# Patient Record
Sex: Female | Born: 2000 | Race: Asian | Hispanic: No | Marital: Single | Smoking: Never smoker
Health system: Southern US, Community
[De-identification: ages and names within clinical notes are randomized; demographics above are authoritative.]

## PROBLEM LIST (undated history)

## (undated) DIAGNOSIS — Z8619 Personal history of other infectious and parasitic diseases: Secondary | ICD-10-CM

## (undated) DIAGNOSIS — T7840XA Allergy, unspecified, initial encounter: Secondary | ICD-10-CM

## (undated) DIAGNOSIS — Z8759 Personal history of other complications of pregnancy, childbirth and the puerperium: Secondary | ICD-10-CM

## (undated) DIAGNOSIS — A749 Chlamydial infection, unspecified: Secondary | ICD-10-CM

## (undated) DIAGNOSIS — Z789 Other specified health status: Secondary | ICD-10-CM

## (undated) HISTORY — DX: Personal history of other infectious and parasitic diseases: Z86.19

## (undated) HISTORY — DX: Chlamydial infection, unspecified: A74.9

## (undated) HISTORY — DX: Allergy, unspecified, initial encounter: T78.40XA

## (undated) HISTORY — PX: WISDOM TOOTH EXTRACTION: SHX21

## (undated) HISTORY — DX: Personal history of other complications of pregnancy, childbirth and the puerperium: Z87.59

## (undated) HISTORY — DX: Other specified health status: Z78.9

---
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
## 2018-02-03 ENCOUNTER — Encounter (HOSPITAL_BASED_OUTPATIENT_CLINIC_OR_DEPARTMENT_OTHER): Payer: Self-pay

## 2018-02-03 ENCOUNTER — Other Ambulatory Visit: Payer: Self-pay

## 2018-02-03 ENCOUNTER — Emergency Department (HOSPITAL_BASED_OUTPATIENT_CLINIC_OR_DEPARTMENT_OTHER)
Admission: EM | Admit: 2018-02-03 | Discharge: 2018-02-04 | Disposition: A | Discharge status: Home / Self Care | Payer: Medicaid Other | Attending: Emergency Medicine | Admitting: Emergency Medicine

## 2018-02-03 DIAGNOSIS — Z79899 Other long term (current) drug therapy: Secondary | ICD-10-CM | POA: Diagnosis not present

## 2018-02-03 DIAGNOSIS — L509 Urticaria, unspecified: Secondary | ICD-10-CM | POA: Diagnosis not present

## 2018-02-03 MED ORDER — PREDNISONE 10 MG PO TABS: 20.0000 mg | ORAL_TABLET | Freq: Every day | ORAL | 0 refills | Status: AC

## 2018-02-03 MED ORDER — METHYLPREDNISOLONE SODIUM SUCC 40 MG IJ SOLR
40.0000 mg | Freq: Once | INTRAMUSCULAR | Status: AC
  Administered 2018-02-03: 40 mg via INTRAMUSCULAR
  Filled 2018-02-03: qty 1

## 2018-02-03 NOTE — ED Triage Notes (Signed)
Pt broke out into hives this morning, NKDA, pt denies new soaps, denies new detergents, denies new foods, took 90mL benadryl at 2030

## 2018-02-03 NOTE — ED Provider Notes (Signed)
[facility] EMERGENCY DEPARTMENT Provider Note  CSN: [id number] Arrival date & time: 02/03/18  2231  History   Chief Complaint Chief Complaint  Patient presents with  . Urticaria    HPI Sherry [name] is a 17 y.o. female with a medical history of allergies who presented to the ED for rash x1 day. Patient reports waking up this morning with hives around her bikini line. She describes these areas as red, raised and pruritic. Throughout the day, this rash has spread to her trunk, back and is now on her forehead. Denies any new exposures, such as clothing, body washes, laundry detergents or anything environmental. Denies recent travel, illness, sick contacts or wounds/abrasions/bites. Denies fever, chills, bleeding, bruising, oral lesions, throat/mouth/lip swelling, chest tightness, palpitations or SOB. Patient tried Benadryl prior to coming to the ED.  Past Medical History:  Diagnosis Date  . Allergy    seasonal    There are no active problems to display for this patient.   History reviewed. No pertinent surgical history.   OB History    Gravida  0   Para  0   Term  0   Preterm  0   AB  0   Living  0     SAB  0   TAB  0   Ectopic  0   Multiple  0   Live Births               Home Medications    Prior to Admission medications   Medication Sig Start Date End Date Taking? Authorizing Provider  metroNIDAZOLE (FLAGYL) 500 MG tablet Take 1 tablet (500 mg total) by mouth 2 (two) times daily. 05/03/16   Trixie Dredge, PA-C  predniSONE (DELTASONE) 10 MG tablet Take 2 tablets (20 mg total) by mouth daily for 5 days. 02/03/18 02/08/18  Mortis, Sharyon Medicus, PA-C    Family History No family history on file.  Social History Social History   Tobacco Use  . Smoking status: Never Smoker  . Smokeless tobacco: Never Used  Substance Use Topics  . Alcohol use: No  . Drug use: No     Allergies   Patient has no known allergies.   Review of Systems Review of  Systems  Constitutional: Negative for chills and fever.  HENT: Negative.   Respiratory: Negative for cough, chest tightness and shortness of breath.   Cardiovascular: Negative for chest pain and palpitations.  Gastrointestinal: Negative.   Musculoskeletal: Negative for arthralgias, joint swelling and myalgias.  Skin: Positive for rash.  Hematological: Does not bruise/bleed easily.   Physical Exam Updated Vital Signs BP 127/74 (BP Location: Right Arm)   Pulse 65   Temp 98.5 F (36.9 C) (Oral)   Resp 16   Ht 5\' 3"  (1.6 m)   Wt 62.6 kg   LMP 02/02/2018   SpO2 100%   BMI 24.45 kg/m   Physical Exam  Constitutional: Vital signs are normal. She appears well-developed and well-nourished.  HENT:  Head: Normocephalic and atraumatic.  Mouth/Throat: Uvula is midline, oropharynx is clear and moist and mucous membranes are normal.  No oral or buccal lesions.  Eyes: Pupils are equal, round, and reactive to light. Conjunctivae, EOM and lids are normal.  No periorbital edema.  Neck: Trachea normal, normal range of motion, full passive range of motion without pain and phonation normal. Neck supple. No neck rigidity. Normal range of motion present.  Cardiovascular: Normal rate, regular rhythm and normal heart sounds.  No murmur heard.  Pulmonary/Chest: Effort normal and breath sounds normal.  Abdominal: Soft. Normal appearance and bowel sounds are normal. There is no tenderness.  Skin: Skin is warm and intact. Capillary refill takes less than 2 seconds. Rash noted. Rash is urticarial.  Multiple erythematous urticarial patches on patient's forehead, back, trunk and upper lower extremities. No areas of desquamation, ulcerations, drainage/bleeding or blisters.  Nursing note and vitals reviewed.    ED Treatments / Results  Labs (all labs ordered are listed, but only abnormal results are displayed) Labs Reviewed - No data to display  EKG None  Radiology No results  found.  Procedures Procedures (including critical care time)  Medications Ordered in ED Medications  methylPREDNISolone sodium succinate (SOLU-MEDROL) 40 mg/mL injection 40 mg (40 mg Intramuscular Given 02/03/18 2352)     Initial Impression / Assessment and Plan / ED Course  Triage vital signs and the nursing notes have been reviewed.  Pertinent labs & imaging results that were available during care of the patient were reviewed and considered in medical decision making (see chart for details).   Patient presents to the ED with an urticarial rash that spontaneously showed up this morning. She denies any new exposures, medications, allergens or travel. Rash is located mainly on her trunk, back and forehead. She denies any signs of anaphylaxis. Rash is not concerning for necrotizing process or SJS/TEN Given that the rash has spread quickly in a short amount of time, will administer IM steroids in the ED and follow with outpatient course of PO steroids.  Final Clinical Impressions(s) / ED Diagnoses  1. Urticaria. IM Solu-Medrol 40mg  x1 given in the ED. Rx for Prednisone x5 days. Education provided on follow-up with PCP vs signs of anaphylaxis that would warrant ED return.  Dispo: Home. After thorough clinical evaluation, this patient is determined to be medically stable and can be safely discharged with the previously mentioned treatment and/or outpatient follow-up/referral(s). At this time, there are no other apparent medical conditions that require further screening, evaluation or treatment.   Final diagnoses:  Urticaria    ED Discharge Orders         Ordered    predniSONE (DELTASONE) 10 MG tablet  Daily     02/03/18 2334            Reva Bores 02/04/18 1000    Gwyneth Sprout, MD 02/04/18 1605

## 2018-02-03 NOTE — Discharge Instructions (Addendum)
I am not sure why you are having this reaction today. You have been given a dose of steroids today. You can start taking the pills tomorrow morning. In addition, you may add Benadryl and Pepcid/Zantac to help with itching.  Follow-up with your PCP if you still have this rash even after taking the steroids.  Follow-up with a medical provider sooner if you begin to have a fever, throat/mouth/lipswelling, difficulty breathing or if the rash starts to bleed, drain or develops into a burn.

## 2018-05-27 NOTE — L&D Delivery Note (Signed)
Delivery Note At 5:57 PM a viable female was delivered via  (Presentation:  ROA).  APGAR: 9, 9; weight pending.   Placenta status: spontaneous, intact.  Cord: 3 vessels    Anesthesia: Epidural Episiotomy:  n/a Lacerations:  none Suture Repair: n/a Est. Blood Loss (mL):  150  With inspection of perineum, 3 small, dime sized hematomas noted. One at bottom of perineum, one inside and on the left side of the introitus and one on the left labia. Will monitor.   Mom to postpartum.  Baby to Couplet care / Skin to Skin.  Wende Mott CNM 05/09/2019, 6:08 PM

## 2018-12-02 LAB — OB RESULTS CONSOLE HEPATITIS B SURFACE ANTIGEN: Hepatitis B Surface Ag: NEGATIVE

## 2018-12-02 LAB — OB RESULTS CONSOLE GC/CHLAMYDIA
Chlamydia: NEGATIVE
Gonorrhea: NEGATIVE

## 2018-12-02 LAB — OB RESULTS CONSOLE HIV ANTIBODY (ROUTINE TESTING): HIV: NONREACTIVE

## 2018-12-02 LAB — OB RESULTS CONSOLE RUBELLA ANTIBODY, IGM: Rubella: IMMUNE

## 2018-12-02 LAB — OB RESULTS CONSOLE VARICELLA ZOSTER ANTIBODY, IGG: Varicella: IMMUNE

## 2018-12-03 ENCOUNTER — Other Ambulatory Visit (HOSPITAL_COMMUNITY): Payer: Self-pay | Admitting: Nurse Practitioner

## 2018-12-03 DIAGNOSIS — Z363 Encounter for antenatal screening for malformations: Secondary | ICD-10-CM

## 2018-12-24 ENCOUNTER — Other Ambulatory Visit: Payer: Self-pay

## 2018-12-24 DIAGNOSIS — Z20822 Contact with and (suspected) exposure to covid-19: Secondary | ICD-10-CM

## 2018-12-25 ENCOUNTER — Ambulatory Visit (HOSPITAL_COMMUNITY): Payer: Medicaid Other

## 2018-12-25 ENCOUNTER — Encounter (HOSPITAL_COMMUNITY): Payer: Self-pay

## 2018-12-26 LAB — NOVEL CORONAVIRUS, NAA: SARS-CoV-2, NAA: NOT DETECTED

## 2019-01-05 ENCOUNTER — Encounter (HOSPITAL_COMMUNITY): Payer: Self-pay | Admitting: *Deleted

## 2019-01-08 ENCOUNTER — Ambulatory Visit (HOSPITAL_COMMUNITY)
Admission: RE | Admit: 2019-01-08 | Discharge: 2019-01-08 | Disposition: A | Discharge status: Home / Self Care | Payer: Medicaid Other | Source: Ambulatory Visit | Attending: Obstetrics and Gynecology | Admitting: Obstetrics and Gynecology

## 2019-01-08 ENCOUNTER — Other Ambulatory Visit: Payer: Self-pay

## 2019-01-08 ENCOUNTER — Other Ambulatory Visit (HOSPITAL_COMMUNITY): Payer: Self-pay | Admitting: *Deleted

## 2019-01-08 DIAGNOSIS — Z363 Encounter for antenatal screening for malformations: Secondary | ICD-10-CM | POA: Diagnosis not present

## 2019-01-08 DIAGNOSIS — Z362 Encounter for other antenatal screening follow-up: Secondary | ICD-10-CM

## 2019-01-08 DIAGNOSIS — Z3A21 21 weeks gestation of pregnancy: Secondary | ICD-10-CM

## 2019-02-05 ENCOUNTER — Ambulatory Visit (HOSPITAL_COMMUNITY): Payer: Medicaid Other

## 2019-02-10 ENCOUNTER — Encounter (HOSPITAL_COMMUNITY): Payer: Self-pay

## 2019-02-10 ENCOUNTER — Other Ambulatory Visit: Payer: Self-pay

## 2019-02-10 ENCOUNTER — Ambulatory Visit (HOSPITAL_COMMUNITY)
Admission: RE | Admit: 2019-02-10 | Discharge: 2019-02-10 | Disposition: A | Discharge status: Home / Self Care | Payer: Medicaid Other | Source: Ambulatory Visit | Attending: Maternal & Fetal Medicine | Admitting: Maternal & Fetal Medicine

## 2019-02-10 ENCOUNTER — Ambulatory Visit (HOSPITAL_COMMUNITY): Payer: Medicaid Other | Admitting: *Deleted

## 2019-02-10 VITALS — BP 121/63 | HR 91 | Temp 98.4°F

## 2019-02-10 DIAGNOSIS — Z3A26 26 weeks gestation of pregnancy: Secondary | ICD-10-CM | POA: Diagnosis not present

## 2019-02-10 DIAGNOSIS — Z362 Encounter for other antenatal screening follow-up: Secondary | ICD-10-CM

## 2019-04-30 LAB — OB RESULTS CONSOLE GC/CHLAMYDIA
Chlamydia: NEGATIVE
Gonorrhea: NEGATIVE

## 2019-04-30 LAB — OB RESULTS CONSOLE GBS: GBS: NEGATIVE

## 2019-05-09 ENCOUNTER — Encounter (HOSPITAL_COMMUNITY): Payer: Self-pay | Admitting: Family Medicine

## 2019-05-09 ENCOUNTER — Inpatient Hospital Stay (HOSPITAL_COMMUNITY)
Admission: AD | Admit: 2019-05-09 | Discharge: 2019-05-11 | DRG: 806 | Disposition: A | Discharge status: Home / Self Care | Payer: Medicaid Other | Attending: Obstetrics and Gynecology | Admitting: Obstetrics and Gynecology

## 2019-05-09 ENCOUNTER — Other Ambulatory Visit: Payer: Self-pay

## 2019-05-09 ENCOUNTER — Inpatient Hospital Stay (HOSPITAL_COMMUNITY): Payer: Medicaid Other | Admitting: Anesthesiology

## 2019-05-09 DIAGNOSIS — O134 Gestational [pregnancy-induced] hypertension without significant proteinuria, complicating childbirth: Secondary | ICD-10-CM | POA: Diagnosis present

## 2019-05-09 DIAGNOSIS — O4202 Full-term premature rupture of membranes, onset of labor within 24 hours of rupture: Secondary | ICD-10-CM | POA: Diagnosis not present

## 2019-05-09 DIAGNOSIS — Z20828 Contact with and (suspected) exposure to other viral communicable diseases: Secondary | ICD-10-CM | POA: Diagnosis present

## 2019-05-09 DIAGNOSIS — O4290 Premature rupture of membranes, unspecified as to length of time between rupture and onset of labor, unspecified weeks of gestation: Secondary | ICD-10-CM | POA: Diagnosis present

## 2019-05-09 DIAGNOSIS — Z23 Encounter for immunization: Secondary | ICD-10-CM

## 2019-05-09 DIAGNOSIS — O26893 Other specified pregnancy related conditions, third trimester: Secondary | ICD-10-CM | POA: Diagnosis present

## 2019-05-09 DIAGNOSIS — Z3A38 38 weeks gestation of pregnancy: Secondary | ICD-10-CM

## 2019-05-09 DIAGNOSIS — N898 Other specified noninflammatory disorders of vagina: Secondary | ICD-10-CM

## 2019-05-09 DIAGNOSIS — O139 Gestational [pregnancy-induced] hypertension without significant proteinuria, unspecified trimester: Secondary | ICD-10-CM

## 2019-05-09 DIAGNOSIS — O4292 Full-term premature rupture of membranes, unspecified as to length of time between rupture and onset of labor: Secondary | ICD-10-CM | POA: Diagnosis present

## 2019-05-09 LAB — PROTEIN / CREATININE RATIO, URINE
Creatinine, Urine: 26.1 mg/dL
Total Protein, Urine: <6 mg/dL

## 2019-05-09 LAB — COMPREHENSIVE METABOLIC PANEL
ALT: 19 U/L (ref 0–44)
AST: 20 U/L (ref 15–41)
Albumin: 2.8 g/dL — ABNORMAL LOW (ref 3.5–5.0)
Alkaline Phosphatase: 148 U/L — ABNORMAL HIGH (ref 38–126)
Anion gap: 12 (ref 5–15)
BUN: 6 mg/dL (ref 6–20)
CO2: 21 mmol/L — ABNORMAL LOW (ref 22–32)
Calcium: 9.6 mg/dL (ref 8.9–10.3)
Chloride: 105 mmol/L (ref 98–111)
Creatinine, Ser: 0.37 mg/dL — ABNORMAL LOW (ref 0.44–1.00)
GFR calc Af Amer: >60 mL/min (ref >60)
GFR calc non Af Amer: >60 mL/min (ref >60)
Glucose, Bld: 94 mg/dL (ref 70–99)
Potassium: 3.8 mmol/L (ref 3.5–5.1)
Sodium: 138 mmol/L (ref 135–145)
Total Bilirubin: 0.5 mg/dL (ref 0.3–1.2)
Total Protein: 6.2 g/dL — ABNORMAL LOW (ref 6.5–8.1)

## 2019-05-09 LAB — TYPE AND SCREEN
ABO/RH(D): O POS
Antibody Screen: NEGATIVE

## 2019-05-09 LAB — CBC
HCT: 36.7 % (ref 36.0–46.0)
Hemoglobin: 11.9 g/dL — ABNORMAL LOW (ref 12.0–15.0)
MCH: 27.5 pg (ref 26.0–34.0)
MCHC: 32.4 g/dL (ref 30.0–36.0)
MCV: 85 fL (ref 80.0–100.0)
Platelets: 216 10*3/uL (ref 150–400)
RBC: 4.32 MIL/uL (ref 3.87–5.11)
RDW: 14 % (ref 11.5–15.5)
WBC: 12.7 10*3/uL — ABNORMAL HIGH (ref 4.0–10.5)
nRBC: 0 % (ref 0.0–0.2)

## 2019-05-09 LAB — RPR: RPR Ser Ql: NONREACTIVE

## 2019-05-09 LAB — SARS CORONAVIRUS 2 (TAT 6-24 HRS): SARS Coronavirus 2: NEGATIVE

## 2019-05-09 LAB — ABO/RH: ABO/RH(D): O POS

## 2019-05-09 MED ORDER — LACTATED RINGERS IV SOLN
INTRAVENOUS | Status: DC
  Administered 2019-05-09 (×2): via INTRAVENOUS

## 2019-05-09 MED ORDER — FENTANYL CITRATE (PF) 100 MCG/2ML IJ SOLN: 50.0000 ug | INTRAMUSCULAR | Status: DC | PRN

## 2019-05-09 MED ORDER — OXYTOCIN 40 UNITS IN NORMAL SALINE INFUSION - SIMPLE MED: 2.5000 [IU]/h | INTRAVENOUS | Status: DC

## 2019-05-09 MED ORDER — ZOLPIDEM TARTRATE 5 MG PO TABS: 5.0000 mg | ORAL_TABLET | Freq: Every evening | ORAL | Status: DC | PRN

## 2019-05-09 MED ORDER — MISOPROSTOL 50MCG HALF TABLET
50.0000 ug | ORAL_TABLET | ORAL | Status: DC | PRN
  Administered 2019-05-09: 50 ug via ORAL
  Filled 2019-05-09: qty 1

## 2019-05-09 MED ORDER — LACTATED RINGERS IV SOLN: 500.0000 mL | Freq: Once | INTRAVENOUS | Status: DC

## 2019-05-09 MED ORDER — OXYTOCIN BOLUS FROM INFUSION
500.0000 mL | Freq: Once | INTRAVENOUS | Status: AC
  Administered 2019-05-09: 18:00:00 500 mL via INTRAVENOUS

## 2019-05-09 MED ORDER — TERBUTALINE SULFATE 1 MG/ML IJ SOLN: 0.2500 mg | Freq: Once | INTRAMUSCULAR | Status: DC | PRN

## 2019-05-09 MED ORDER — DIBUCAINE (PERIANAL) 1 % EX OINT: 1.0000 "application " | TOPICAL_OINTMENT | CUTANEOUS | Status: DC | PRN

## 2019-05-09 MED ORDER — BENZOCAINE-MENTHOL 20-0.5 % EX AERO: 1.0000 "application " | INHALATION_SPRAY | CUTANEOUS | Status: DC | PRN

## 2019-05-09 MED ORDER — LIDOCAINE HCL (PF) 1 % IJ SOLN
INTRAMUSCULAR | Status: DC | PRN
  Administered 2019-05-09: 10 mL via EPIDURAL
  Administered 2019-05-09: 2 mL via EPIDURAL

## 2019-05-09 MED ORDER — PHENYLEPHRINE 40 MCG/ML (10ML) SYRINGE FOR IV PUSH (FOR BLOOD PRESSURE SUPPORT): 80.0000 ug | PREFILLED_SYRINGE | INTRAVENOUS | Status: DC | PRN

## 2019-05-09 MED ORDER — EPHEDRINE 5 MG/ML INJ: 10.0000 mg | INTRAVENOUS | Status: DC | PRN

## 2019-05-09 MED ORDER — SOD CITRATE-CITRIC ACID 500-334 MG/5ML PO SOLN: 30.0000 mL | ORAL | Status: DC | PRN

## 2019-05-09 MED ORDER — ACETAMINOPHEN 325 MG PO TABS: 650.0000 mg | ORAL_TABLET | ORAL | Status: DC | PRN

## 2019-05-09 MED ORDER — PRENATAL MULTIVITAMIN CH
1.0000 | ORAL_TABLET | Freq: Every day | ORAL | Status: DC
  Administered 2019-05-10 – 2019-05-11 (×2): 1 via ORAL
  Filled 2019-05-09 (×2): qty 1

## 2019-05-09 MED ORDER — COCONUT OIL OIL
1.0000 "application " | TOPICAL_OIL | Status: DC | PRN
  Administered 2019-05-10: 1 via TOPICAL

## 2019-05-09 MED ORDER — FENTANYL-BUPIVACAINE-NACL 0.5-0.125-0.9 MG/250ML-% EP SOLN: 12.0000 mL/h | EPIDURAL | Status: DC | PRN

## 2019-05-09 MED ORDER — SIMETHICONE 80 MG PO CHEW: 80.0000 mg | CHEWABLE_TABLET | ORAL | Status: DC | PRN

## 2019-05-09 MED ORDER — DIPHENHYDRAMINE HCL 50 MG/ML IJ SOLN: 12.5000 mg | INTRAMUSCULAR | Status: DC | PRN

## 2019-05-09 MED ORDER — IBUPROFEN 600 MG PO TABS
600.0000 mg | ORAL_TABLET | Freq: Four times a day (QID) | ORAL | Status: DC
  Administered 2019-05-10 – 2019-05-11 (×3): 600 mg via ORAL
  Filled 2019-05-09 (×6): qty 1

## 2019-05-09 MED ORDER — OXYTOCIN 40 UNITS IN NORMAL SALINE INFUSION - SIMPLE MED
1.0000 m[IU]/min | INTRAVENOUS | Status: DC
  Administered 2019-05-09: 2 m[IU]/min via INTRAVENOUS
  Filled 2019-05-09: qty 1000

## 2019-05-09 MED ORDER — FENTANYL-BUPIVACAINE-NACL 0.5-0.125-0.9 MG/250ML-% EP SOLN
EPIDURAL | Status: AC
  Filled 2019-05-09: qty 250

## 2019-05-09 MED ORDER — ONDANSETRON HCL 4 MG/2ML IJ SOLN: 4.0000 mg | Freq: Four times a day (QID) | INTRAMUSCULAR | Status: DC | PRN

## 2019-05-09 MED ORDER — ONDANSETRON HCL 4 MG/2ML IJ SOLN: 4.0000 mg | INTRAMUSCULAR | Status: DC | PRN

## 2019-05-09 MED ORDER — LIDOCAINE HCL (PF) 1 % IJ SOLN: 30.0000 mL | INTRAMUSCULAR | Status: DC | PRN

## 2019-05-09 MED ORDER — LACTATED RINGERS IV SOLN: 500.0000 mL | INTRAVENOUS | Status: DC | PRN

## 2019-05-09 MED ORDER — TETANUS-DIPHTH-ACELL PERTUSSIS 5-2.5-18.5 LF-MCG/0.5 IM SUSP: 0.5000 mL | Freq: Once | INTRAMUSCULAR | Status: DC

## 2019-05-09 MED ORDER — SENNOSIDES-DOCUSATE SODIUM 8.6-50 MG PO TABS
2.0000 | ORAL_TABLET | ORAL | Status: DC
  Administered 2019-05-10 (×2): 2 via ORAL
  Filled 2019-05-09 (×2): qty 2

## 2019-05-09 MED ORDER — WITCH HAZEL-GLYCERIN EX PADS: 1.0000 "application " | MEDICATED_PAD | CUTANEOUS | Status: DC | PRN

## 2019-05-09 MED ORDER — DIPHENHYDRAMINE HCL 25 MG PO CAPS: 25.0000 mg | ORAL_CAPSULE | Freq: Four times a day (QID) | ORAL | Status: DC | PRN

## 2019-05-09 MED ORDER — SODIUM CHLORIDE (PF) 0.9 % IJ SOLN
INTRAMUSCULAR | Status: DC | PRN
  Administered 2019-05-09: 12 mL/h via EPIDURAL

## 2019-05-09 MED ORDER — SODIUM BICARBONATE 8.4 % IV SOLN
INTRAVENOUS | Status: DC | PRN
  Administered 2019-05-09: 5 mL via EPIDURAL

## 2019-05-09 MED ORDER — ONDANSETRON HCL 4 MG PO TABS: 4.0000 mg | ORAL_TABLET | ORAL | Status: DC | PRN

## 2019-05-09 NOTE — H&P (Signed)
LABOR AND DELIVERY ADMISSION HISTORY AND PHYSICAL NOTE  Sherry [name] is a 18 y.o. female G1P0000 with IUP at 6832w5d by LMP c/w 26wk US presenting for SROM.   Had leakage of fluid around 0300 this AM, clear fluid. Since then has had constant trickling.   She reports positive fetal movement. She denies vaginal bleeding. Not feeling contractions.   She plans on breast feeding. She requests Depo for birth control.  Prenatal History/Complications: [facility] [place]:  @[redacted]w[redacted]d , CWD, normal anatomy, cephalic presentation, posterior placenta, 36%ile, EFW 890 grams  Pregnancy complications:  - none  Past Medical History: Past Medical History:  Diagnosis Date  . Allergy    seasonal  . Medical history non-contributory     Past Surgical History: Past Surgical History:  Procedure Laterality Date  . WISDOM TOOTH EXTRACTION      Obstetrical History: OB History    Gravida  1   Para  0   Term  0   Preterm  0   AB  0   Living  0     SAB  0   TAB  0   Ectopic  0   Multiple  0   Live Births              Social History: Social History   Socioeconomic History  . Marital status: Single    Spouse name: n/a  . Number of children: 0  . Years of education: Not on file  . Highest education level: Not on file  Occupational History  . Occupation: student    Comment: [employer]  Tobacco Use  . Smoking status: Never Smoker  . Smokeless tobacco: Never Used  Substance and Sexual Activity  . Alcohol use: No  . Drug use: No  . Sexual activity: Never    Birth control/protection: None  Other Topics Concern  . Not on file  Social History Narrative   8th grader at [employer].   Lives with her mother and older brother.   Older sister lives with maternal grandmother.   Sees father some evenings and weekends.  She's not comfortable communicating with him.   Social Determinants of Health   Financial Resource Strain:   . Difficulty of Paying Living Expenses:  Not on file  Food Insecurity:   . Worried About Programme researcher, broadcasting/film/videounning Out of Food in the Last Year: Not on file  . Ran Out of Food in the Last Year: Not on file  Transportation Needs:   . Lack of Transportation (Medical): Not on file  . Lack of Transportation (Non-Medical): Not on file  Physical Activity:   . Days of Exercise per Week: Not on file  . Minutes of Exercise per Session: Not on file  Stress:   . Feeling of Stress : Not on file  Social Connections:   . Frequency of Communication with Friends and Family: Not on file  . Frequency of Social Gatherings with Friends and Family: Not on file  . Attends Religious Services: Not on file  . Active Member of Clubs or Organizations: Not on file  . Attends BankerClub or Organization Meetings: Not on file  . Marital Status: Not on file    Family History: History reviewed. No pertinent family history.  Allergies: No Known Allergies  Medications Prior to Admission  Medication Sig Dispense Refill Last Dose  . prenatal vitamin w/FE, FA (PRENATAL 1 + 1) 27-1 MG TABS tablet Take 1 tablet by mouth daily at 12 noon.  05/08/2019 at Unknown time     Review of Systems  All systems reviewed and negative except as stated in HPI  Physical Exam Blood pressure 130/77, pulse 92, temperature 97.9 F (36.6 C), temperature source Oral, resp. rate 17, height 5' 3.5" (1.613 m), weight 78.6 kg, last menstrual period 08/11/2018. General appearance: alert, oriented, NAD Lungs: normal respiratory effort Heart: regular rate Abdomen: soft, non-tender; gravid, leopolds 2800 g Extremities: No calf swelling or tenderness Presentation: cephalic by RN SVE Fetal monitoringBaseline: 135 bpm, Variability: Good {> 6 bpm), Accelerations: Reactive and Decelerations: Absent Uterine activity: rare Dilation: (deferred per Dr. Dione Plover) Effacement (%): 50 Station: -2 Exam by:: Denyse Dago, RN  Prenatal labs: ABO, Rh: --/--/O POS, O POS Performed at [facility], [place] [address] [place], [place] [address]  [phone]) Antibody: NEG (12/13 0520) Rubella:   Immune 12/02/2018 RPR:   NR 03/01/2019 HBsAg:   NR 12/02/2018 HIV:   Neg 04/30/2019 GC/Chlamydia: neg/neg 04/30/2019  GBS:   Neg 04/30/2019 2-hr GTT: 1hr abnormal 153-->3hr negative (74, 179, 144, 105) Genetic screening:  Quad screen neg 12/02/2018 Anatomy US: normal  Prenatal Transfer Tool  Maternal Diabetes: No Genetic Screening: Normal Maternal Ultrasounds/Referrals: Normal Fetal Ultrasounds or other Referrals:  None Maternal Substance Abuse:  No Significant Maternal Medications:  None Significant Maternal Lab Results: Group B Strep negative  Results for orders placed or performed during the hospital encounter of 05/09/19 (from the past 24 hour(s))  CBC   Collection Time: 05/09/19  5:15 AM  Result Value Ref Range   WBC 12.7 (H) 4.0 - 10.5 K/uL   RBC 4.32 3.87 - 5.11 MIL/uL   Hemoglobin 11.9 (L) 12.0 - 15.0 g/dL   HCT 36.7 36.0 - 46.0 %   MCV 85.0 80.0 - 100.0 fL   MCH 27.5 26.0 - 34.0 pg   MCHC 32.4 30.0 - 36.0 g/dL   RDW 14.0 11.5 - 15.5 %   Platelets 216 150 - 400 K/uL   nRBC 0.0 0.0 - 0.2 %  Comprehensive metabolic panel   Collection Time: 05/09/19  5:15 AM  Result Value Ref Range   Sodium 138 135 - 145 mmol/L   Potassium 3.8 3.5 - 5.1 mmol/L   Chloride 105 98 - 111 mmol/L   CO2 21 (L) 22 - 32 mmol/L   Glucose, Bld 94 70 - 99 mg/dL   BUN 6 6 - 20 mg/dL   Creatinine, Ser 0.37 (L) 0.44 - 1.00 mg/dL   Calcium 9.6 8.9 - 10.3 mg/dL   Total Protein 6.2 (L) 6.5 - 8.1 g/dL   Albumin 2.8 (L) 3.5 - 5.0 g/dL   AST 20 15 - 41 U/L   ALT 19 0 - 44 U/L   Alkaline Phosphatase 148 (H) 38 - 126 U/L   Total Bilirubin 0.5 0.3 - 1.2 mg/dL   GFR calc non Af Amer >60 >60 mL/min   GFR calc Af Amer >60 >60 mL/min   Anion gap 12 5 - 15  Type and screen [place]   Collection Time: 05/09/19  5:20 AM  Result Value Ref Range   ABO/RH(D) O POS    Antibody Screen NEG    Sample Expiration       05/12/2019,2359 Performed at [facility], [address], [place], [place] [address]   ABO/Rh   Collection Time: 05/09/19  5:20 AM  Result Value Ref Range   ABO/RH(D)      O POS Performed at [facility], [address] [place], [place] [address]   Protein / creatinine ratio, urine   Collection Time: 05/09/19  6:01 AM  Result Value Ref Range   Creatinine, Urine 26.10 mg/dL   Total Protein, Urine <6 mg/dL   Protein Creatinine Ratio        0.00 - 0.15 mg/mg[Cre]    Patient Active Problem List   Diagnosis Date Noted  . Premature rupture of membranes 05/09/2019    Assessment: Caitlyn Desrosiers is a 18 y.o. G1P0000 at [redacted]w[redacted]d here for SROM.  #Labor: Primip, cervix still thick, given initial miso at 0619. Offered but patient declined FB, will consider at next check #Pain: IV pain meds PRN, epidural upon request #FWB: Cat I #GBS/ID:  negative #COVID: swab pending #MOF: Breast #MOC: Depo #Circ:  N/a, female  Venora Maples 05/09/2019, 7:00 AM

## 2019-05-09 NOTE — Anesthesia Preprocedure Evaluation (Signed)
Anesthesia Evaluation  Patient identified by MRN, date of birth, ID band Patient awake    Reviewed: Allergy & Precautions, Patient's Chart, lab work & pertinent test results  Airway Mallampati: II  TM Distance: >3 FB Neck ROM: Full    Dental no notable dental hx. (+) Teeth Intact, Dental Advisory Given   Pulmonary neg pulmonary ROS,    Pulmonary exam normal breath sounds clear to auscultation       Cardiovascular negative cardio ROS Normal cardiovascular exam Rhythm:Regular Rate:Normal     Neuro/Psych negative neurological ROS  negative psych ROS   GI/Hepatic negative GI ROS, Neg liver ROS,   Endo/Other  negative endocrine ROS  Renal/GU negative Renal ROS  negative genitourinary   Musculoskeletal negative musculoskeletal ROS (+)   Abdominal   Peds negative pediatric ROS (+)  Hematology negative hematology ROS (+)   Anesthesia Other Findings   Reproductive/Obstetrics (+) Pregnancy                             Anesthesia Physical Anesthesia Plan  ASA: II and emergent  Anesthesia Plan: Epidural   Post-op Pain Management:    Induction:   PONV Risk Score and Plan:   Airway Management Planned: Natural Airway  Additional Equipment: None  Intra-op Plan:   Post-operative Plan:   Informed Consent: I have reviewed the patients History and Physical, chart, labs and discussed the procedure including the risks, benefits and alternatives for the proposed anesthesia with the patient or authorized representative who has indicated his/her understanding and acceptance.       Plan Discussed with:   Anesthesia Plan Comments:         Anesthesia Quick Evaluation

## 2019-05-09 NOTE — MAU Note (Signed)
Patient reports to MAU c/o SROM @0330  clear fluid. +FM but states the baby is not as active as normal. No bleeding or ctx.

## 2019-05-09 NOTE — Progress Notes (Signed)
Labor Progress Note Sherry [name] is a 18 y.o. G1P0000 at [redacted]w[redacted]d presented for SROM  S:  Patient comfortable. Not feeling contractions  O:  BP 120/78   Pulse 84   Temp 98.2 F (36.8 C) (Oral)   Resp 16   Ht 5' 3.5" (1.613 m)   Wt 78.6 kg   LMP 08/11/2018   BMI 30.22 kg/m   Fetal Tracing:  Baseline: 125 Variability: moderate Accels: 15x15 Decels: none  Toco: none   CVE: Dilation: 2.5 Effacement (%): 90 Cervical Position: Posterior Station: 0 Presentation: Vertex Exam by:: Chrys Racer, CNM    A&P: 18 y.o. G1P0000 [redacted]w[redacted]d SROM #Labor: Progressing well. Will start pitocin 2x2 #Pain: per patient request #FWB: Cat 1 #GBS negative  Wende Mott, CNM 10:39 AM

## 2019-05-09 NOTE — Anesthesia Procedure Notes (Signed)
Epidural Patient location during procedure: OR Start time: 05/09/2019 3:55 PM End time: 05/09/2019 4:04 PM  Staffing Anesthesiologist: Pervis Hocking, DO Performed: anesthesiologist   Preanesthetic Checklist Completed: patient identified, IV checked, risks and benefits discussed, monitors and equipment checked, pre-op evaluation and timeout performed  Epidural Patient position: sitting Prep: DuraPrep and site prepped and draped Patient monitoring: continuous pulse ox, blood pressure, heart rate and cardiac monitor Approach: midline Location: L3-L4 Injection technique: LOR air  Needle:  Needle type: Tuohy  Needle gauge: 17 G Needle length: 9 cm Needle insertion depth: 4.5 cm Catheter type: closed end flexible Catheter size: 19 Gauge Catheter at skin depth: 10 cm Test dose: negative  Assessment Sensory level: T8 Events: blood not aspirated, injection not painful, no injection resistance, no paresthesia and negative IV test  Additional Notes Patient identified. Risks/Benefits/Options discussed with patient including but not limited to bleeding, infection, nerve damage, paralysis, failed block, incomplete pain control, headache, blood pressure changes, nausea, vomiting, reactions to medication both or allergic, itching and postpartum back pain. Confirmed with bedside nurse the patient's most recent platelet count. Confirmed with patient that they are not currently taking any anticoagulation, have any bleeding history or any family history of bleeding disorders. Patient expressed understanding and wished to proceed. All questions were answered. Sterile technique was used throughout the entire procedure. Please see nursing notes for vital signs. Test dose was given through epidural catheter and negative prior to continuing to dose epidural or start infusion. Warning signs of high block given to the patient including shortness of breath, tingling/numbness in hands, complete motor  block, or any concerning symptoms with instructions to call for help. Patient was given instructions on fall risk and not to get out of bed. All questions and concerns addressed with instructions to call with any issues or inadequate analgesia.  Reason for block:procedure for pain

## 2019-05-09 NOTE — Discharge Summary (Signed)
Postpartum Discharge Summary  Date of Service updated-yes     Patient Name: Sherry [name] DOB: 2000/06/18 MRN: [id number]  Date of admission: 05/09/2019 Delivering Provider: Wende Mott   Date of discharge: 05/11/2019  Admitting diagnosis: Premature rupture of membranes [O42.90] Intrauterine pregnancy: [redacted]w[redacted]d    Secondary diagnosis:  Active Problems:   Premature rupture of membranes   Vaginal hematoma   Gestational hypertension  Additional problems: none     Discharge diagnosis: Term Pregnancy Delivered                                                                                                Post partum procedures: Depo Provera  Augmentation: Pitocin and Cytotec  Complications: None  Hospital course:  Onset of Labor With Vaginal Delivery     18y.o. yo G1P0000 at 32w5das admitted in Latent Labor on 05/09/2019. Patient had an uncomplicated labor course as follows: Patient presented for SROM, received one cytotec and pitocin augmentation to deliver Membrane Rupture Time/Date: 3:00 AM ,05/09/2019   Intrapartum Procedures: Episiotomy: None [1]                                         Lacerations:  None [1]  Patient had a delivery of a Viable infant. 05/09/2019  Information for the patient's newborn:  Kotecki, Girl LiLorri0[[id number]]Delivery Method: Vaginal, Spontaneous(Filed from Delivery Summary)     Pateint had an uncomplicated postpartum course.  She is ambulating, tolerating a regular diet, passing flatus, and urinating well. Patient is discharged home in stable condition on 05/11/19.  Delivery time: 5:57 PM    Magnesium Sulfate received: No BMZ received: No Rhophylac:N/A MMR:N/A Transfusion:No  Physical exam  Vitals:   05/10/19 0815 05/10/19 1401 05/10/19 2126 05/11/19 0604  BP: 117/62 104/83 121/60 112/71  Pulse: 78 82 88 75  Resp: _0 Temp: 98.6 F (37 C) 98.8 F (37.1 C) 98.2 F (36.8 C) 97.7 F (36.5 C)  TempSrc: Oral Oral Oral  Oral  SpO2:   96% 99%  Weight:      Height:       General: alert, cooperative and no distress Lochia: appropriate Uterine Fundus: firm Incision: N/A DVT Evaluation: No evidence of DVT seen on physical exam. Negative Homan's sign. No cords or calf tenderness. No significant calf/ankle edema. Labs: Lab Results  Component Value Date   WBC 18.2 (H) 05/10/2019   HGB 11.9 (L) 05/10/2019   HCT 36.1 05/10/2019   MCV 85.5 05/10/2019   PLT 216 05/10/2019   CMP Latest Ref Rng & Units 05/09/2019  Glucose 70 - 99 mg/dL 94  BUN 6 - 20 mg/dL 6  Creatinine 0.44 - 1.00 mg/dL 0.37(L)  Sodium 135 - 145 mmol/L 138  Potassium 3.5 - 5.1 mmol/L 3.8  Chloride 98 - 111 mmol/L 105  CO2 22 - 32 mmol/L 21(L)  Calcium 8.9 - 10.3 mg/dL 9.6  Total Protein 6.5 - 8.1 g/dL 6.2(L)  Total Bilirubin 0.3 -  1.2 mg/dL 0.5  Alkaline Phos 38 - 126 U/L 148(H)  AST 15 - 41 U/L 20  ALT 0 - 44 U/L 19    Discharge instruction: per After Visit Summary and "Baby and Me Booklet".  After visit meds:  Allergies as of 05/11/2019   No Known Allergies     Medication List    TAKE these medications   ibuprofen 600 MG tablet Commonly known as: ADVIL Take 1 tablet (600 mg total) by mouth every 6 (six) hours.   prenatal vitamin w/FE, FA 27-1 MG Tabs tablet Take 1 tablet by mouth daily at 12 noon.       Diet: routine diet  Activity: Advance as tolerated. Pelvic rest for 6 weeks.   Outpatient follow up: 1 week for BP check at [place] Follow up Appt:No future appointments. Follow up Visit:  [facility] patient  Newborn Data: Live born female  Birth Weight: 6'9 APGAR: ,   Newborn Delivery   Birth date/time: 05/09/2019 17:57:00 Delivery type:       Baby Feeding: Breast Disposition:home with mother

## 2019-05-09 NOTE — Progress Notes (Signed)
Labor Progress Note Sherry [name] is a 18 y.o. G1P0000 at [redacted]w[redacted]d presented for SROM  S:  Patient beginning to get uncomfortable with contractions  O:  BP 130/75   Pulse 79   Temp 98.9 F (37.2 C) (Oral)   Resp 18   Ht 5' 3.5" (1.613 m)   Wt 78.6 kg   LMP 08/11/2018   BMI 30.22 kg/m   Fetal Tracing:  Baseline: 125 Variability: moderate Accels: 15x15 Decels: variable  Toco: 1-2   CVE: Dilation: 2.5 Effacement (%): 90 Cervical Position: Posterior Station: 0 Presentation: Vertex Exam by:: Chrys Racer, CNM    A&P: 18 y.o. G1P0000 [redacted]w[redacted]d SROM #Labor: Progressing well. Continue to titrate pitocin. Will recheck in 1-2 hours #Pain: per patient request #FWB: Cat 1 #GBS negative  Wende Mott, CNM 2:14 PM

## 2019-05-10 ENCOUNTER — Encounter (HOSPITAL_COMMUNITY): Payer: Self-pay | Admitting: Family Medicine

## 2019-05-10 DIAGNOSIS — O139 Gestational [pregnancy-induced] hypertension without significant proteinuria, unspecified trimester: Secondary | ICD-10-CM

## 2019-05-10 LAB — CBC
HCT: 36.1 % (ref 36.0–46.0)
Hemoglobin: 11.9 g/dL — ABNORMAL LOW (ref 12.0–15.0)
MCH: 28.2 pg (ref 26.0–34.0)
MCHC: 33 g/dL (ref 30.0–36.0)
MCV: 85.5 fL (ref 80.0–100.0)
Platelets: 216 10*3/uL (ref 150–400)
RBC: 4.22 MIL/uL (ref 3.87–5.11)
RDW: 14.2 % (ref 11.5–15.5)
WBC: 18.2 10*3/uL — ABNORMAL HIGH (ref 4.0–10.5)
nRBC: 0 % (ref 0.0–0.2)

## 2019-05-10 MED ORDER — INFLUENZA VAC SPLIT QUAD 0.5 ML IM SUSY
0.5000 mL | PREFILLED_SYRINGE | INTRAMUSCULAR | Status: AC
  Administered 2019-05-11: 11:00:00 0.5 mL via INTRAMUSCULAR
  Filled 2019-05-10: qty 0.5

## 2019-05-10 MED ORDER — MEDROXYPROGESTERONE ACETATE 150 MG/ML IM SUSP
150.0000 mg | Freq: Once | INTRAMUSCULAR | Status: AC
  Administered 2019-05-10: 150 mg via INTRAMUSCULAR
  Filled 2019-05-10: qty 1

## 2019-05-10 NOTE — Lactation Note (Addendum)
This note was copied from a baby's chart. Lactation Consultation Note  Patient Name: Sherry [name] [id number] Date: 05/10/2019 Reason for consult: Mother's request;Difficult latch P1, 28 hour female infant. Infant had 6 stools and 4 voids today. Per mom, infant latches well on right breast but not her left and it  takes a while to get infant latched.  Mom was breastfeeding infant on right breast using cross cradle hold as LC entered the room. [place] asked mom to break latch to place infant on left side to work with latch, mom pulled infant off breast not breaking latch, per mom, she did not know about sticking her finger in infant's mouth to break latch.  Mom latched infant on left breast using football hold position, LC asked mom to rub breast below infant's nose and immediately infant open mouth wide, latched with swallows infant breastfeed for 10 minutes. [place] asked mom to break latch with her finger and nipple was rounded when infant came off breast. Infant appeared content and went asleep mom was doing STS when [facility] left room with infant. [place] reviewed with parents infant may start cluster feeding now that she is past 24 hours of life. Mom knows to call RN or LC if she has any questions, concerns or need assistance with latching infant at breast.   Maternal Data    Feeding    LATCH Score Latch: Grasps breast easily, tongue down, lips flanged, rhythmical sucking.  Audible Swallowing: Spontaneous and intermittent  Type of Nipple: Everted at rest and after stimulation  Comfort (Breast/Nipple): Soft / non-tender  Hold (Positioning): Assistance needed to correctly position infant at breast and maintain latch.  LATCH Score: 9  Interventions Interventions: Skin to skin;Support pillows;Position options;Breast massage;Assisted with latch;Adjust position;Breast compression  Lactation Tools Discussed/Used     Consult Status Consult Status: Follow-up Date: 05/11/19 Follow-up type:  In-patient    Vicente Serene 05/10/2019, 10:13 PM

## 2019-05-10 NOTE — Lactation Note (Signed)
This note was copied from a baby's chart. Lactation Consultation Note  Patient Name: Sherry [name] [id number] Date: 05/10/2019 Reason for consult: Initial assessment;Early term 37-38.6wks;Primapara;1st time breastfeeding  Attempted to visit with mother, however, she was getting ready to go into the bathroom.  Asked her to call me when she is able to visit with me.     Maternal Data    Feeding Feeding Type: Breast Milk  LATCH Score Latch: Repeated attempts needed to sustain latch, nipple held in mouth throughout feeding, stimulation needed to elicit sucking reflex.  Audible Swallowing: A few with stimulation  Type of Nipple: Everted at rest and after stimulation  Comfort (Breast/Nipple): Soft / non-tender  Hold (Positioning): No assistance needed to correctly position infant at breast.  LATCH Score: 8  Interventions Interventions: Assisted with latch(left side assistance needed)  Lactation Tools Discussed/Used     Consult Status Consult Status: Follow-up Date: 05/10/19 Follow-up type: In-patient    Piper Albro R Joanne Brander 05/10/2019, 4:07 PM

## 2019-05-10 NOTE — Lactation Note (Signed)
This note was copied from a baby's chart. Lactation Consultation Note  Patient Name: Sherry [name] Date: 05/10/2019 Reason for consult: Initial assessment;Early term 37-38.6wks;Primapara;1st time breastfeeding  P1 mother whose infant is now 84 hours old.  This is an ETI at 38+5 weeks.  RN in room when I arrived.  Offered to assist with latching and mother accepted. Taught hand expression but she was unable to express colostrum at this time.  Positioned mother appropriately and assisted baby to latch in the cross cradle position on the left breast.  She latched but was not interested in sucking.  A few attempts made with the same result.  Reassured mother that this was typical behavior for a baby at this age.  Placed her STS on mother's chest and she fell asleep.  Hat on and blanket to cover.  Encouraged to feed 8-12 times/24 hours or sooner if baby shows feeding cues.  Reviewed cues and colostrum container provided.  Milk storage times discussed.  Finger feeding demonstrated.  Spoon at bedside and instructions for parents to review.    Mom made aware of O/P services, breastfeeding support groups, community resources, and our phone # for post-discharge questions. Mother has a DEBP for home use.  She will return to work and school.  Discussed pumping and storing of milk.  Encouraged mother to call her RN/LC for latch assistance as needed.  Father present and supportive.   Maternal Data Formula Feeding for Exclusion: No Has patient been taught Hand Expression?: Yes Does the patient have breastfeeding experience prior to this delivery?: No  Feeding Feeding Type: Breast Fed  LATCH Score Latch: Too sleepy or reluctant, no latch achieved, no sucking elicited.  Audible Swallowing: None  Type of Nipple: Everted at rest and after stimulation  Comfort (Breast/Nipple): Soft / non-tender  Hold (Positioning): Assistance needed to correctly position infant at breast and maintain  latch.  LATCH Score: 5  Interventions Interventions: Breast feeding basics reviewed;Assisted with latch;Skin to skin;Breast massage;Hand express;Adjust position;Position options;Support pillows  Lactation Tools Discussed/Used WIC Program: Yes   Consult Status Consult Status: Follow-up Date: 05/11/19 Follow-up type: In-patient    Little Ishikawa 05/10/2019, 5:12 PM

## 2019-05-10 NOTE — Anesthesia Postprocedure Evaluation (Signed)
Anesthesia Post Note  Patient: Sherry [name]  Procedure(s) Performed: AN AD HOC LABOR EPIDURAL     Patient location during evaluation: Mother Baby Anesthesia Type: Epidural Level of consciousness: awake Pain management: satisfactory to patient Vital Signs Assessment: post-procedure vital signs reviewed and stable Respiratory status: spontaneous breathing Cardiovascular status: stable Anesthetic complications: no    Last Vitals:  Vitals:   05/10/19 0508 05/10/19 0815  BP: 115/65 117/62  Pulse: 84 78  Resp: 18 20  Temp: 36.4 C 37 C  SpO2: 99%     Last Pain:  Vitals:   05/10/19 0815  TempSrc: Oral  PainSc: 0-No pain   Pain Goal:                   [employer]

## 2019-05-10 NOTE — Progress Notes (Signed)
POSTPARTUM PROGRESS NOTE  Post Partum Day 1  Subjective:  Sherry [name] is a 18 y.o. G1P1001 s/p NSVD at [redacted]w[redacted]d.  She reports she is doing well. No acute events overnight. She denies any problems with ambulating, voiding or po intake. Denies nausea or vomiting.  Pain is well controlled.  Lochia is appropriate.  Objective: Blood pressure 117/62, pulse 78, temperature 98.6 F (37 C), resp. rate 18, height 5' 3.5" (1.613 m), weight 78.6 kg, last menstrual period 08/11/2018, SpO2 99 %, unknown if currently breastfeeding.  Physical Exam:  General: alert, cooperative and no distress Chest: no respiratory distress Heart:regular rate, distal pulses intact Abdomen: soft, nontender,  Uterine Fundus: firm, appropriately tender DVT Evaluation: No calf swelling or tenderness Extremities: no LE edema Skin: warm, dry  Recent Labs    05/09/19 0515 05/10/19 0513  HGB 11.9* 11.9*  HCT 36.7 36.1    Assessment/Plan: Sherry [name] is a 18 y.o. G1P1001 s/p NSVD at [redacted]w[redacted]d   PPD#1 - Doing well  Routine postpartum care gHTN: normotensive, asymptomatic Contraception: Depo, ordered Feeding: Breast Dispo: Plan for discharge PPD#2.   LOS: 1 day   Augustin Coupe, MD/[facility] OB Fellow  05/10/2019, 8:33 AM

## 2019-05-11 MED ORDER — IBUPROFEN 600 MG PO TABS: 600.0000 mg | ORAL_TABLET | Freq: Four times a day (QID) | ORAL | 0 refills | Status: DC

## 2019-05-11 NOTE — Lactation Note (Signed)
This note was copied from a baby's chart. Lactation Consultation Note  Patient Name: Sherry [name] [id number] Date: 05/11/2019 Reason for consult: Follow-up assessment;1st time breastfeeding;Primapara;Infant weight loss;Other (Comment)(7 % weight loss) Baby is 66 hours old  Baby awake and hungry after PKU. Mom latched the baby and LC checked the depth and the lips flanged , swallows increased with breast compressions.  Mom denies soreness. Sore nipples and engorgement prevention and tx reviewed . LC instructed mom on the use of shells between feedings except when sleeping, and hand pump . Provided a #27 F for when the milk come sin.  Per mom will be using her sisters DEBP . LC recommended after 4 feedings a day post pump both breast for 10 -15 mins / spoon feed back to baby.  Mom has the [facility] pamphlet with phone numbers .   Maternal Data Has patient been taught Hand Expression?: Yes  Feeding Feeding Type: Breast Fed  LATCH Score Latch: Repeated attempts needed to sustain latch, nipple held in mouth throughout feeding, stimulation needed to elicit sucking reflex.  Audible Swallowing: A few with stimulation  Type of Nipple: Everted at rest and after stimulation  Comfort (Breast/Nipple): Soft / non-tender  Hold (Positioning): No assistance needed to correctly position infant at breast.  LATCH Score: 8  Interventions Interventions: Breast feeding basics reviewed  Lactation Tools Discussed/Used Pump Review: Setup, frequency, and cleaning   Consult Status Consult Status: Follow-up Date: 05/11/19 Follow-up type: In-patient    [place] 05/11/2019, 9:23 AM

## 2019-05-18 ENCOUNTER — Ambulatory Visit: Payer: Self-pay

## 2019-05-18 NOTE — Telephone Encounter (Signed)
Yesterday pt stated she was having more intense cramping and took Ibuprofen and got relief. Later that evening pt passed a quarter sized blood clot. Pt stated the cramping is better today and no further clots noted. Pt denies abdominal pain today. Pt delivered vaginally on 05/09/19. Pt is breast feeding.  Care advice given and pt verbalized understanding.     Reason for Disposition . Normal postpartum bleeding  Answer Assessment - Initial Assessment Questions 1. ONSET: "Describe your bleeding: is it getting worse, staying the same, improving, or stopping and starting?"     same 2. AMOUNT: "How much bleeding are you having today?"      - Slight: spotting, or pinkish / brownish mucous discharge; used less than one pad total    - MILD - less than one pad per hour; similar to menstrual bleeding    - MODERATE - blood clots; 1-2 pads/hour    - SEVERE: continuous red blood from vagina; large blood clots (e.g., golf ball size); > 2 pads/hour or not contained by pads     slight 3. ABDOMINAL PAIN: "Do you have any pain?" "How bad is the pain?" "What does it keep you from doing?"     - MILD -  doesn't interfere with normal activities, abdomen soft and not tender to touch     - MODERATE - interferes with normal activities or awakens from sleep, tender to touch     - SEVERE - excruciating pain, doubled over, unable to do any normal activities       No abd pain 4. HORMONES: "Are you taking any birth control medications?" (e.g., birth control pills, Depo-Provera)    n/a 5. BLOOD THINNERS: "Do you take any blood thinners?" (e.g., coumadin, aspirin)     no 6. OTHER SYMPTOMS: "Do you have any other symptoms?" (e.g., fever, chills, dizziness)     no 7. DELIVERY DATE: "When was your delivery date?" "Vaginal delivery or c-section?"     05/09/19 vaginal 8. BREASTFEEDING: "Are you breastfeeding?"     yes  Protocols used: POSTPARTUM - VAGINAL BLEEDING AND LOCHIA-A-AH

## 2019-11-01 IMAGING — US US MFM OB COMP + 14 WK
1 series · 14 of 28 positions shown · non-contrast
Comparison: none

[Series 1: us mfm ob comp + 14 wk · 14 of 73 slices shown]
[im 3/73]
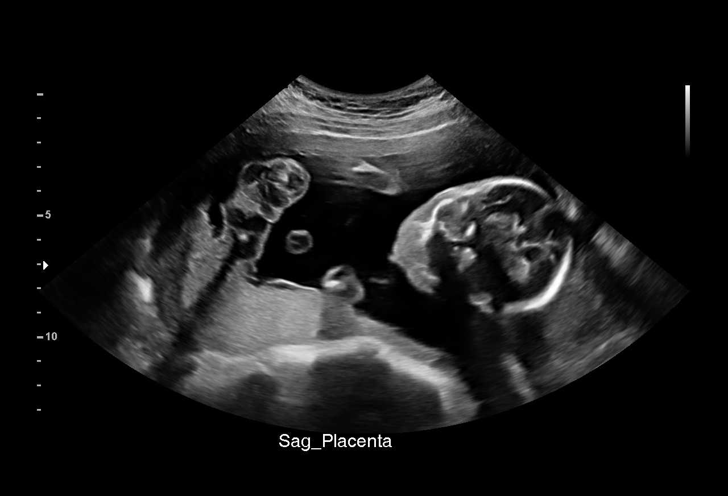
[im 9/73]
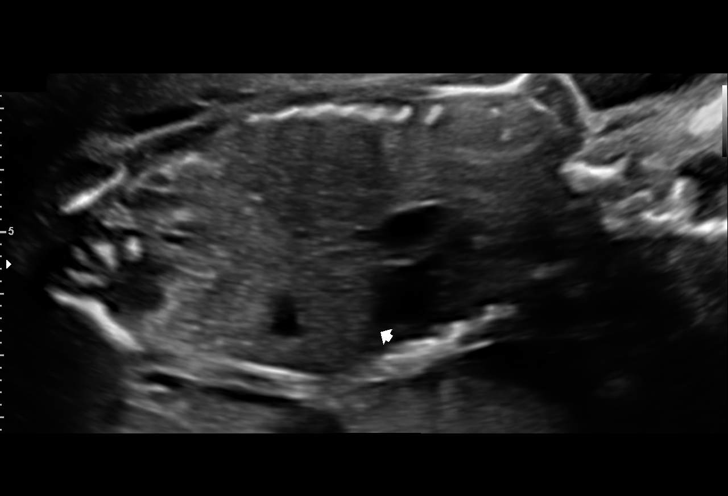
[im 14/73]
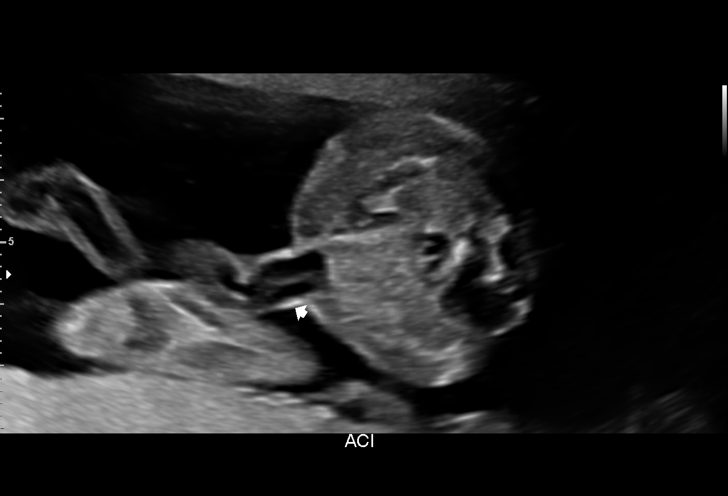
[im 19/73]
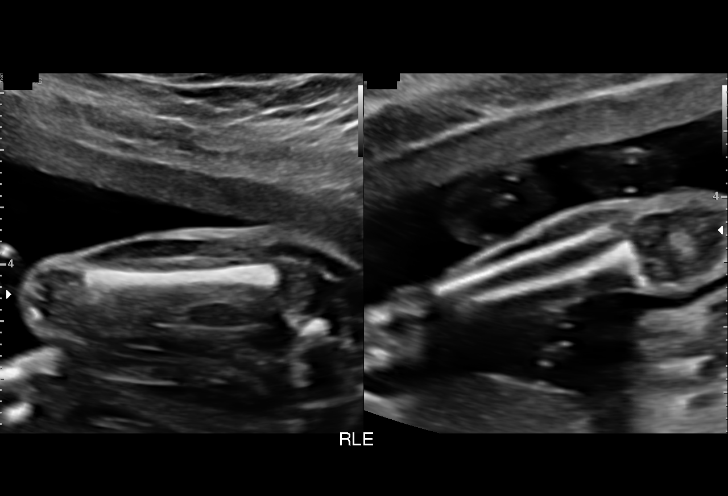
[im 25/73]
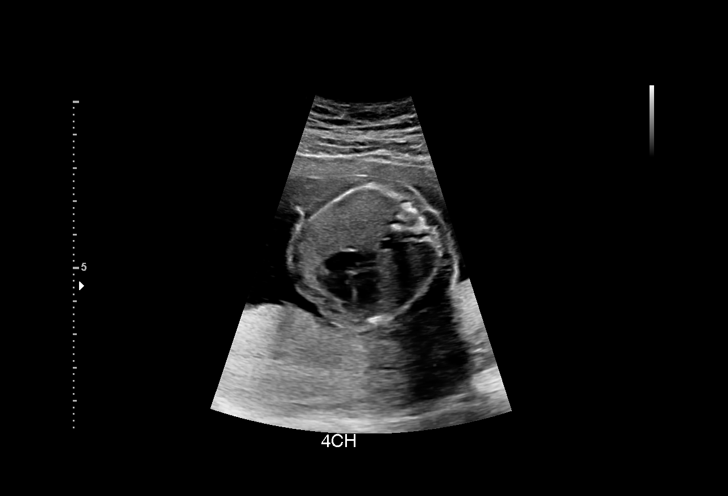
[im 30/73]
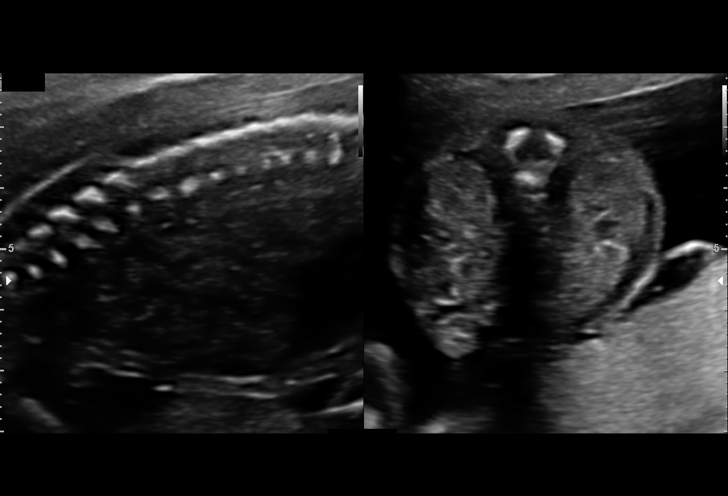
[im 35/73]
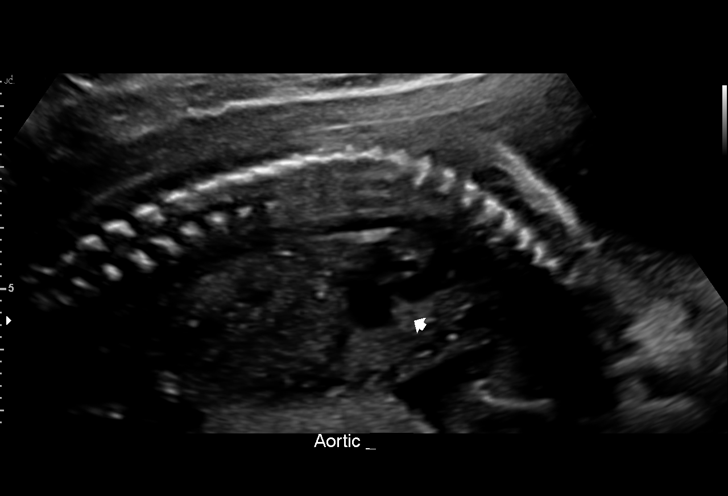
[im 41/73]
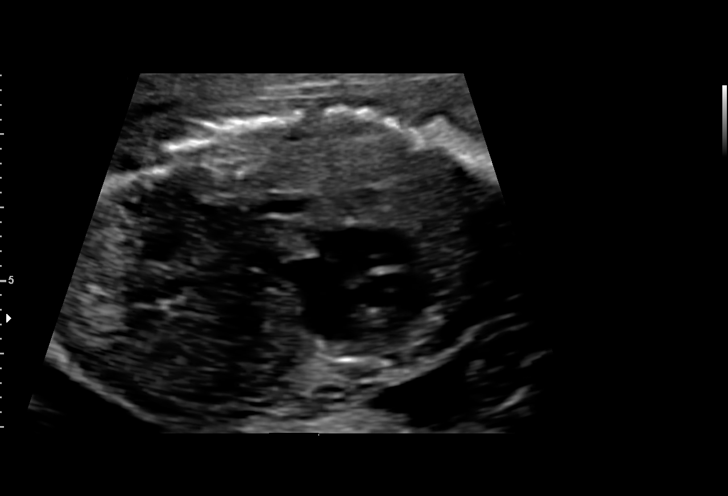
[im 46/73]
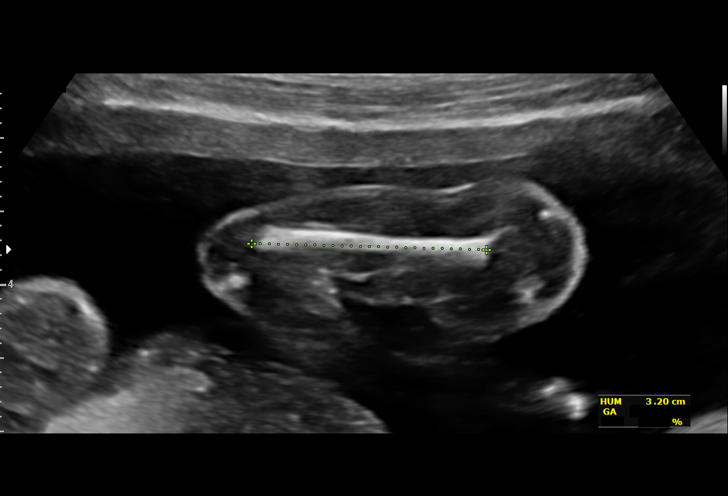
[im 51/73]
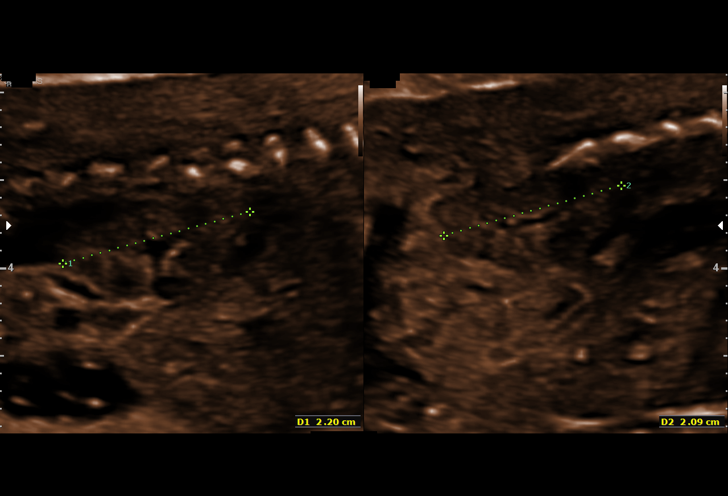
[im 57/73]
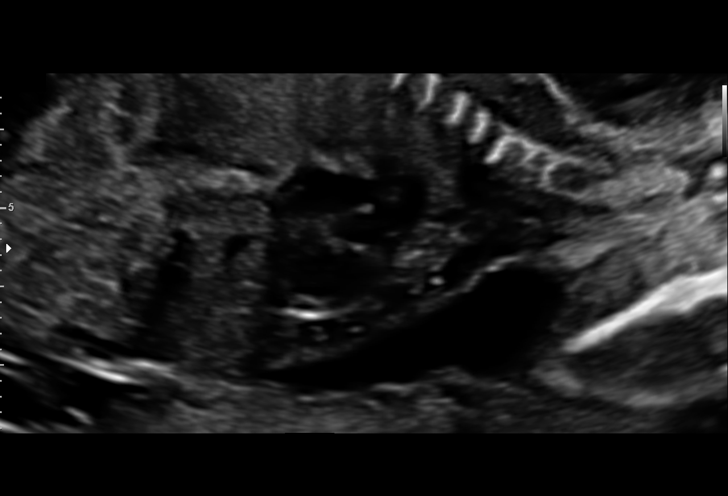
[im 62/73]
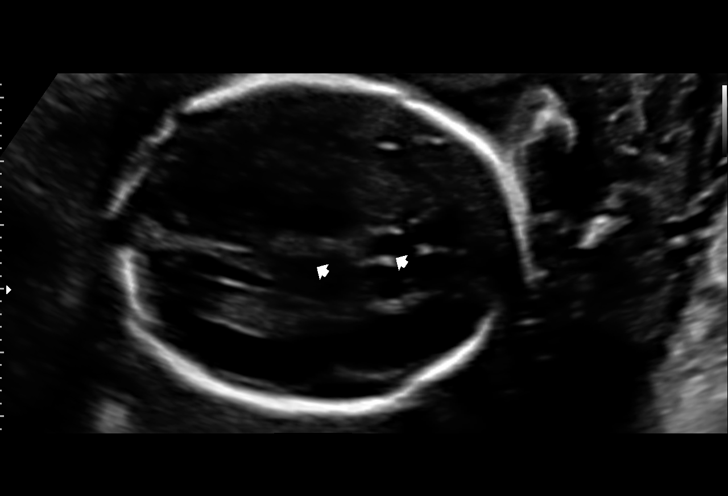
[im 67/73]
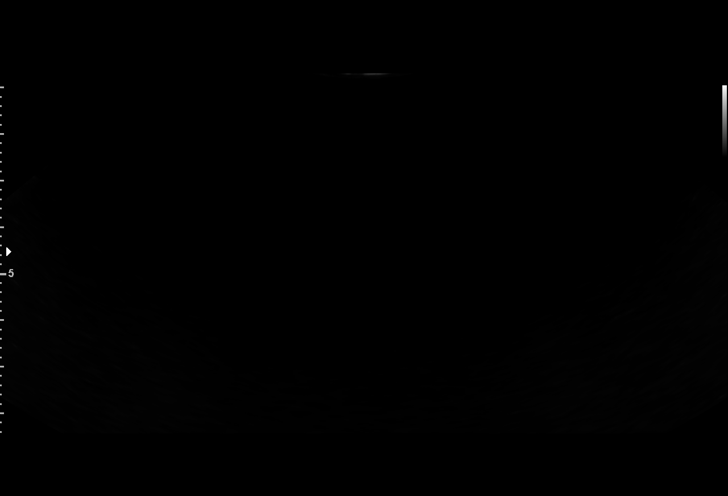
[im 73/73]
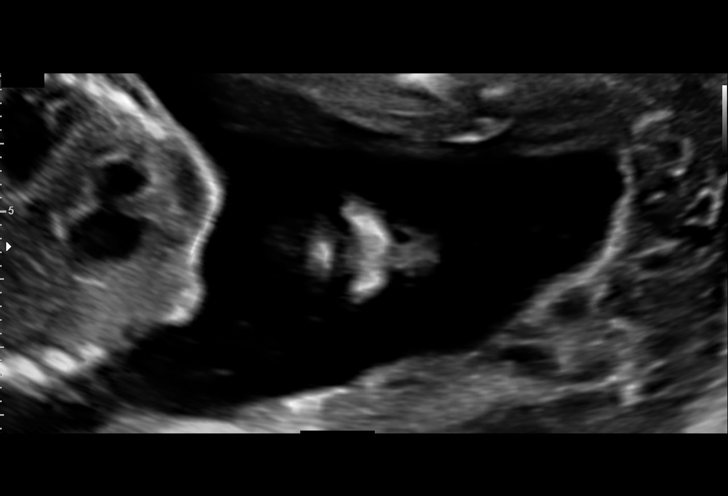

[14 of 28 positions shown; findings below may reference images not displayed]

----------------------------------------------------------------------

 ----------------------------------------------------------------------
Indications

  Encounter for antenatal screening for
  malformations
  21 weeks gestation of pregnancy
 ----------------------------------------------------------------------
Vital Signs

 BMI:
Fetal Evaluation

 Num Of Fetuses:          1
 Fetal Heart Rate(bpm):   150
 Cardiac Activity:        Observed
 Presentation:            Cephalic
 Placenta:                Posterior
 P. Cord Insertion:       Visualized

 Amniotic Fluid
 AFI FV:      Within normal limits

                             Largest Pocket(cm)

Biometry

 BPD:      51.7  mm     G. Age:  21w 5d         59  %    CI:            75  %    70 - 86
                                                         FL/HC:       17.8  %    15.9 -
 HC:      189.4  mm     G. Age:  21w 2d         31  %    HC/AC:       1.17       1.06 -
 AC:      161.3  mm     G. Age:  21w 2d         36  %    FL/BPD:      65.2  %
 FL:       33.7  mm     G. Age:  20w 4d         15  %    FL/AC:       20.9  %    20 - 24
 HUM:      31.4  mm     G. Age:  20w 3d         22  %
 CER:      22.7  mm     G. Age:  21w 2d         47  %
 NFT:       3.6  mm
 LV:        5.9  mm
 CM:        4.9  mm
 Est. FW:     391   gm   0 lb 14 oz      23  %
OB History

 Gravidity:    1         Term:   0        Prem:   0        SAB:   0
 TOP:          0       Ectopic:  0        Living: 0
Gestational Age

 LMP:           21w 3d        Date:  08/11/18                 EDD:   05/18/19
 U/S Today:     21w 2d                                        EDD:   05/19/19
 Best:          21w 3d     Det. By:  LMP  (08/11/18)          EDD:   05/18/19
Anatomy

 Cranium:               Appears normal         Aortic Arch:            Appears normal
 Cavum:                 Appears normal         Ductal Arch:            Not well visualized
 Ventricles:            Appears normal         Diaphragm:              Appears normal
 Choroid Plexus:        Appears normal         Stomach:                Appears normal, left
                                                                       sided
 Cerebellum:            Appears normal         Abdomen:                Appears normal
 Posterior Fossa:       Appears normal         Abdominal Wall:         Appears nml (cord
                                                                       insert, abd wall)
 Nuchal Fold:           Not applicable (>20    Cord Vessels:           Appears normal (3
                        wks GA)                                        vessel cord)
 Face:                  Orbits appear          Kidneys:                Appear normal
                        normal
 Lips:                  Appears normal         Bladder:                Appears normal
 Thoracic:              Appears normal         Spine:                  Appears normal
 Heart:                 Appears normal         Upper Extremities:      Appears normal
                        (4CH, axis, and
                        situs)
 RVOT:                  Appears normal         Lower Extremities:      Appears normal
 LVOT:                  Appears normal

 Other:  Female gender Technically difficult due to fetal position.
Impression

 Normal interval growth.  No ultrasonic evidence of structural
 fetal anomalies.
 Suboptimal views of the ductal arch, otherwise anatomy
 complete
Recommendations

 Follow up growth in 4 weeks.

## 2020-03-25 IMAGING — US US MFM OB FOLLOW-UP
1 series · 13 of 28 positions shown · non-contrast
Comparison: none

[Series 1: us mfm ob follow-up · 36 acquisitions, 13 frames shown]
[im 2/36]
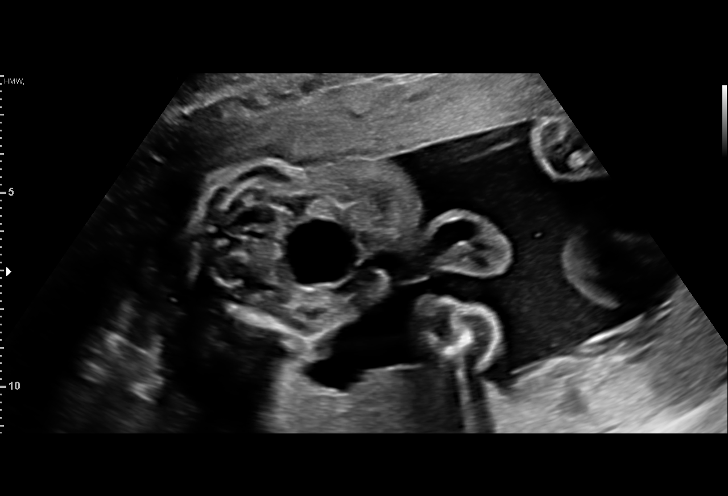
[im 4/36]
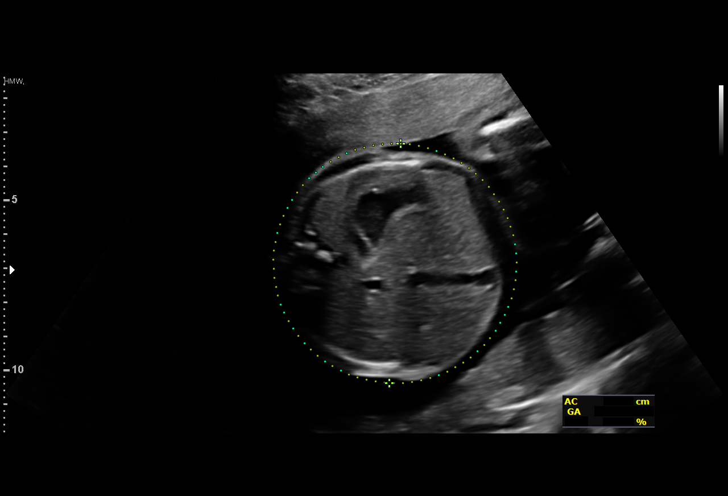
[im 7/36]
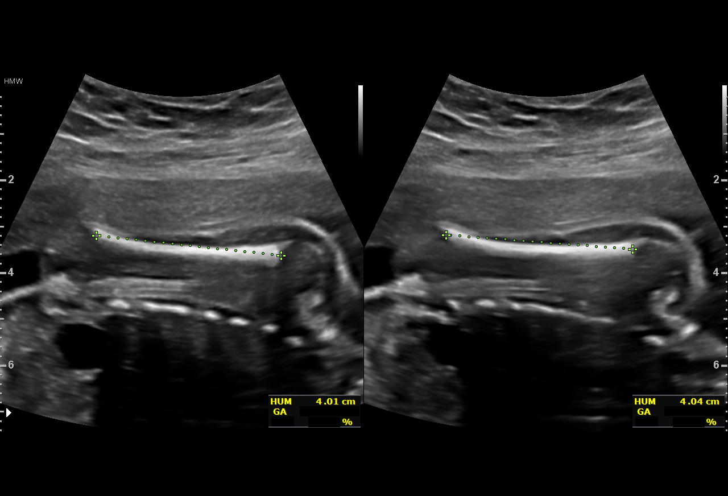
[im 10/36]
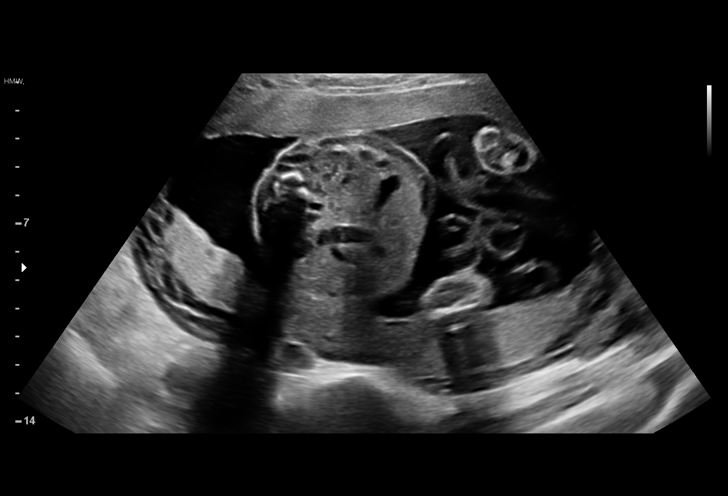
[im 12/36]
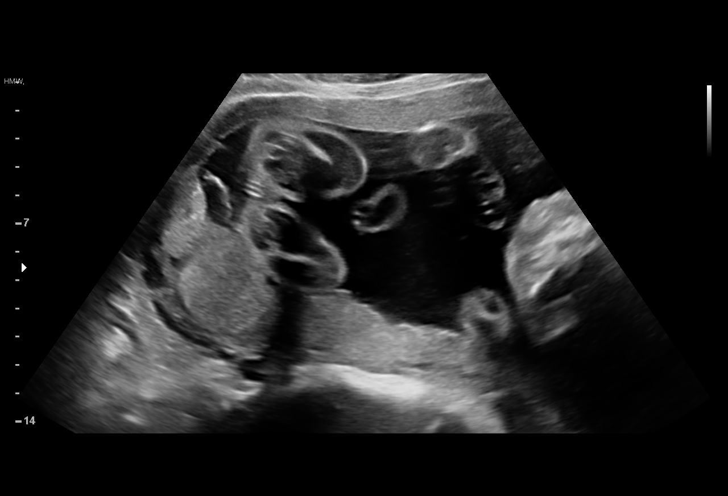
[im 15/36]
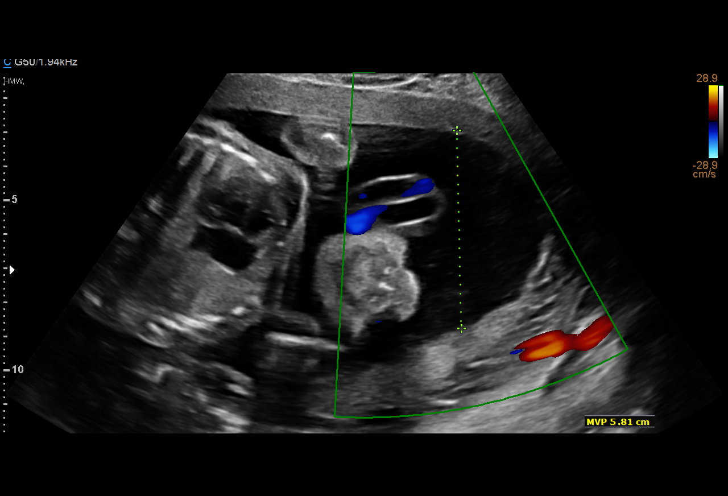
[im 19/36]
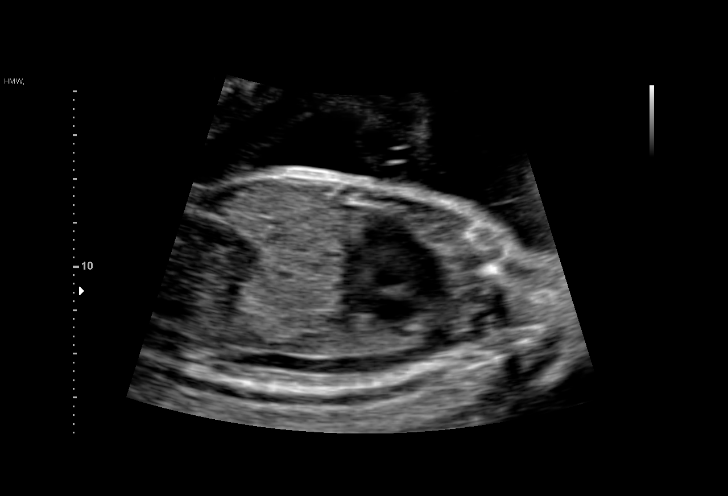
[im 21/36]
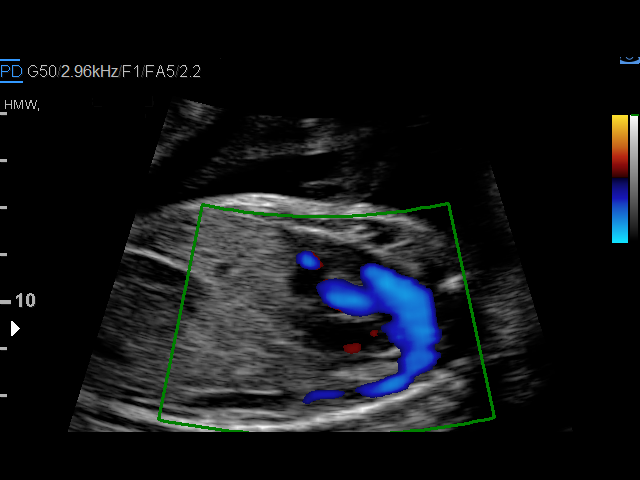
[im 24/36]
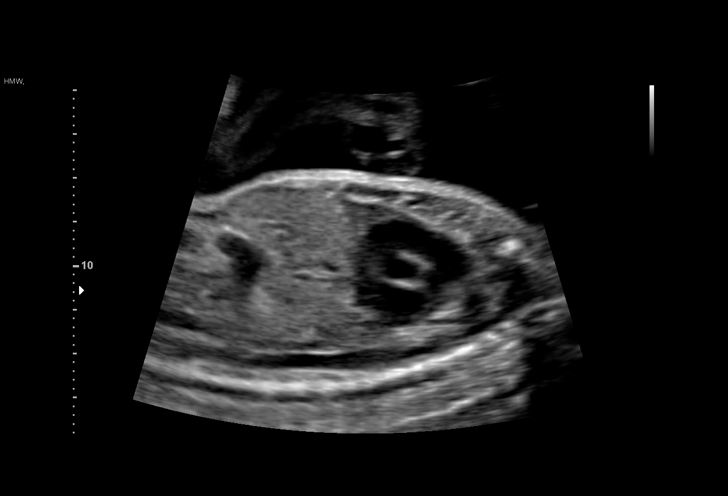
[im 26/36]
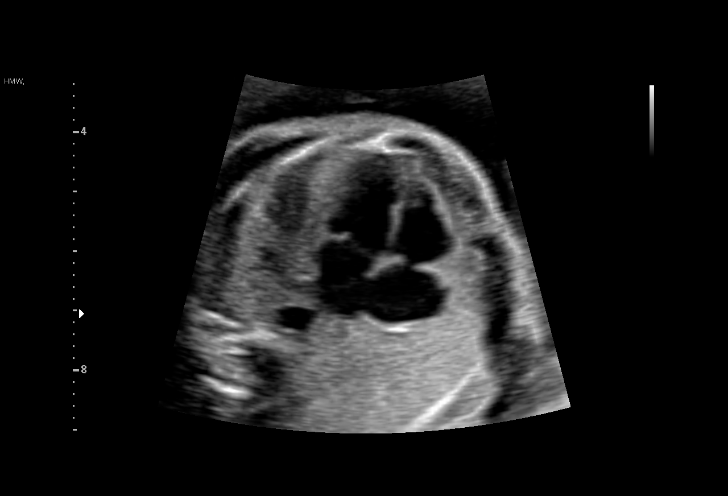
[im 29/36]
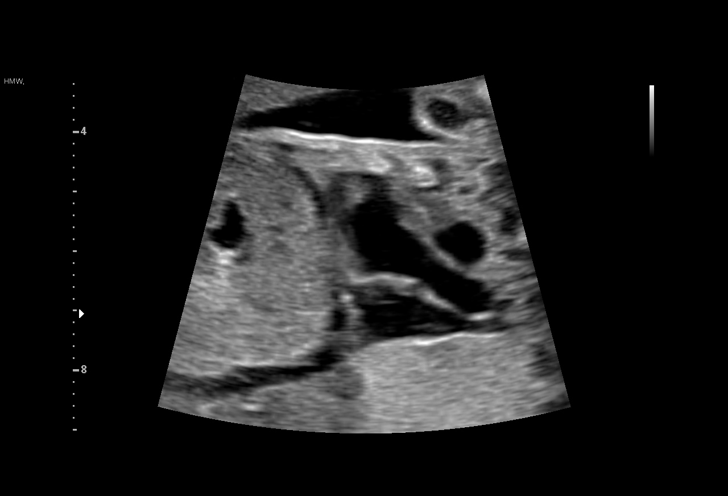
[im 32/36]
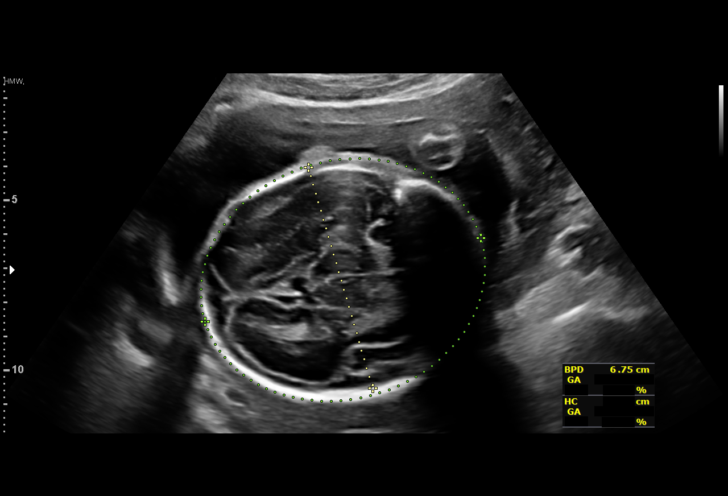
[im 34/36]
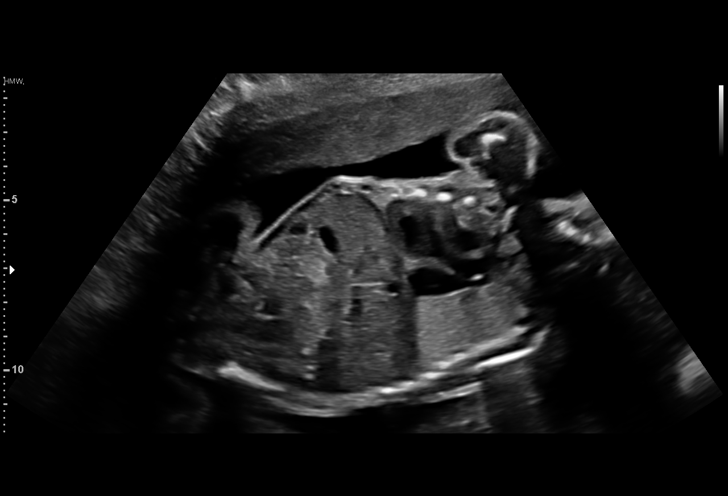

[13 of 28 positions shown; findings below may reference images not displayed]

CYNDIE
 ----------------------------------------------------------------------

 ----------------------------------------------------------------------
Indications

  26 weeks gestation of pregnancy
  Encounter for other antenatal screening
  follow-up
 ----------------------------------------------------------------------
Vital Signs

                                                Height:        5'3"
Fetal Evaluation

 Num Of Fetuses:          1
 Fetal Heart Rate(bpm):   157
 Cardiac Activity:        Observed
 Presentation:            Cephalic
 Placenta:                Posterior
 P. Cord Insertion:       Previously Visualized

 Amniotic Fluid
 AFI FV:      Within normal limits

                             Largest Pocket(cm)

Biometry

 BPD:      66.8  mm     G. Age:  26w 6d         67  %    CI:        73.64   %    70 - 86
                                                         FL/HC:       18.7  %    18.6 -
 HC:      247.3  mm     G. Age:  26w 6d         49  %    HC/AC:       1.13       1.04 -
 AC:      218.8  mm     G. Age:  26w 2d         47  %    FL/BPD:      69.3  %    71 - 87
 FL:       46.3  mm     G. Age:  25w 3d         16  %    FL/AC:       21.2  %    20 - 24
 HUM:      40.2  mm     G. Age:  24w 3d        < 5  %

 Est. FW:     890   gm   1 lb 15 oz      36  %
OB History

 Gravidity:    1         Term:   0        Prem:   0        SAB:   0
 TOP:          0       Ectopic:  0        Living: 0
Gestational Age

 LMP:           26w 1d        Date:  08/11/18                 EDD:   05/18/19
 U/S Today:     26w 3d                                        EDD:   05/16/19
 Best:          26w 1d     Det. By:  LMP  (08/11/18)          EDD:   05/18/19
Anatomy

 Cranium:               Appears normal         Aortic Arch:            Appears normal
 Cavum:                 Previously seen        Ductal Arch:            Appears normal
 Ventricles:            Previously seen        Diaphragm:              Appears normal
 Choroid Plexus:        Previously seen        Stomach:                Appears normal, left
                                                                       sided
 Cerebellum:            Previously seen        Abdomen:                Appears normal
 Posterior Fossa:       Previously seen        Abdominal Wall:         Appears nml (cord
                                                                       insert, abd wall)
 Nuchal Fold:           Not applicable (>20    Cord Vessels:           Appears normal (3
                        wks GA)                                        vessel cord)
 Face:                  Profile nl; orbits     Kidneys:                Appear normal
                        prev seen
 Lips:                  Previously seen        Bladder:                Appears normal
 Thoracic:              Appears normal         Spine:                  Previously seen
 Heart:                 Appears normal         Upper Extremities:      Previously seen
                        (4CH, axis, and
                        situs)
 RVOT:                  Previously seen        Lower Extremities:      Previously seen
 LVOT:                  Appears normal

 Other:  Female gender Technically difficult due to fetal position.
Cervix Uterus Adnexa

 Cervix
 Not visualized (advanced GA >42wks)

 Uterus
 No abnormality visualized.

 Left Ovary
 No adnexal mass visualized.

 Right Ovary
 No adnexal mass visualized.

 Cul De Sac
 No free fluid seen.

 Adnexa
 No abnormality visualized.
Impression

 Normal interval growth.
 Anatomy completed
 Good fetal movement and amniotic fluid
Recommendations

 Follow up as clinically indicated.

## 2022-05-27 NOTE — L&D Delivery Note (Signed)
OB/GYN Faculty Practice Delivery Note  Sherry [name] is a 22 y.o. [id number] s/p NSVD at [redacted]w[redacted]d. She was admitted for normal labor.   ROM: 0h 61m with clear fluid GBS Status: Negative/-- (08/26 1138) Maximum Maternal Temperature: Temp (24hrs), Avg:98.6 F (37 C), Min:98.1 F (36.7 C), Max:98.8 F (37.1 C)   Labor Progress: Initial SVE: 3/30/-3.  No  required. She then progressed to complete.   Delivery Date/Time: 02/04/23 2042 Delivery: Called to room and patient was complete and pushing. Head delivered MOA. No nuchal cord present. Shoulder and body delivered in usual fashion. Infant with spontaneous cry, placed on mother's abdomen, dried and stimulated. Cord clamped x 2 after 1-minute delay, and cut by mother. Cord blood drawn. Placenta delivered spontaneously with gentle cord traction. Fundus firm with massage and Pitocin. Labia, perineum, vagina, and cervix inspected with first degree perineal which was not hemostatic. Area numbed with lidocaine ~10 cc and then repaired in a running fashion with 3-0 vicryl.  Baby Weight: pending  Placenta: 3 vessel, intact. Sent to L&D Complications: None Lacerations: 1st deg perineal EBL: 175 mL Anesthesia: local  Infant:  APGAR (1 MIN): 9  APGAR (5 MINS): 9  APGAR (10 MINS):    Joanne Gavel, MD [facility] Family Medicine Fellow, [facility] for [facility], [facility] 02/04/2023, 10:52 PM

## 2022-06-24 ENCOUNTER — Encounter: Payer: Self-pay | Admitting: Family

## 2022-06-24 ENCOUNTER — Ambulatory Visit: Payer: BC Managed Care – PPO | Admitting: Family

## 2022-06-24 VITALS — BP 112/75 | HR 74 | Temp 98.4°F | Ht 63.0 in | Wt 178.2 lb

## 2022-06-24 DIAGNOSIS — R110 Nausea: Secondary | ICD-10-CM

## 2022-06-24 DIAGNOSIS — Z3201 Encounter for pregnancy test, result positive: Secondary | ICD-10-CM | POA: Diagnosis not present

## 2022-06-24 DIAGNOSIS — N926 Irregular menstruation, unspecified: Secondary | ICD-10-CM

## 2022-06-24 LAB — POCT URINE PREGNANCY: Preg Test, Ur: POSITIVE — AB

## 2022-06-24 MED ORDER — DOXYLAMINE-PYRIDOXINE 10-10 MG PO TBEC: 1.0000 | DELAYED_RELEASE_TABLET | Freq: Four times a day (QID) | ORAL | 0 refills | Status: DC | PRN

## 2022-06-24 NOTE — Progress Notes (Signed)
   Patient ID: Sherry [name], female    DOB: [birth year], 22 y.o.   MRN: [id number]  Chief Complaint  Patient presents with   New Patient (Initial Visit)   Amenorrhea    Pt states she missed her cycle, Positive pregnancy test at home a week ago. Referral to obgyn    HPI:      Missed menses:  pt has a positive pregnancy test at home. LMP 12/13/203; reports nausea, headaches, breast tenderness.      Assessment & Plan:  1. Missed menses -  - POCT urine pregnancy  2. Positive pregnancy test - LMP 05/08/2022; pt advised on pregnancy precautions, sending referral.   - Ambulatory referral to Obstetrics / Gynecology  3. Nausea in adult patient -  - Doxylamine-Pyridoxine (DICLEGIS) 10-10 MG TBEC; Take 1-2 tablets by mouth 4 (four) times daily as needed. START with 2 pills qhs tonight, then during day prn - max dose 4 pills in 24h  Dispense: 60 tablet; Refill: 0   Subjective:    Outpatient Medications Prior to Visit  Medication Sig Dispense Refill   ibuprofen (ADVIL) 600 MG tablet Take 1 tablet (600 mg total) by mouth every 6 (six) hours. 30 tablet 0   prenatal vitamin w/FE, FA (PRENATAL 1 + 1) 27-1 MG TABS tablet Take 1 tablet by mouth daily at 12 noon.     No facility-administered medications prior to visit.   Past Medical History:  Diagnosis Date   Allergy    seasonal   Medical history non-contributory    Past Surgical History:  Procedure Laterality Date   WISDOM TOOTH EXTRACTION     No Known Allergies    Objective:    Physical Exam Vitals and nursing note reviewed.  Constitutional:      Appearance: Normal appearance.  Cardiovascular:     Rate and Rhythm: Normal rate and regular rhythm.  Pulmonary:     Effort: Pulmonary effort is normal.     Breath sounds: Normal breath sounds.  Musculoskeletal:        General: Normal range of motion.  Skin:    General: Skin is warm and dry.  Neurological:     Mental Status: She is alert.  Psychiatric:        Mood  and Affect: Mood normal.        Behavior: Behavior normal.    BP 112/75 (BP Location: Left Arm, Patient Position: Sitting, Cuff Size: Large)   Pulse 74   Temp 98.4 F (36.9 C) (Temporal)   Ht 5\' 3"  (1.6 m)   Wt 178 lb 3.2 oz (80.8 kg)   SpO2 99%   BMI 31.57 kg/m  Wt Readings from Last 3 Encounters:  06/24/22 178 lb 3.2 oz (80.8 kg)  05/09/19 173 lb 4.8 oz (78.6 kg)  02/03/18 138 lb (62.6 kg) (69 %, Z= 0.50)*   * Growth percentiles are based on CDC (Girls, 2-20 Years) data.      Jeanie Sewer, NP

## 2022-07-23 ENCOUNTER — Ambulatory Visit (INDEPENDENT_AMBULATORY_CARE_PROVIDER_SITE_OTHER): Payer: BC Managed Care – PPO

## 2022-07-23 ENCOUNTER — Encounter (HOSPITAL_BASED_OUTPATIENT_CLINIC_OR_DEPARTMENT_OTHER): Payer: Self-pay | Admitting: Obstetrics & Gynecology

## 2022-07-23 ENCOUNTER — Ambulatory Visit (INDEPENDENT_AMBULATORY_CARE_PROVIDER_SITE_OTHER): Payer: BC Managed Care – PPO | Admitting: *Deleted

## 2022-07-23 ENCOUNTER — Other Ambulatory Visit (HOSPITAL_COMMUNITY)
Admission: RE | Admit: 2022-07-23 | Discharge: 2022-07-23 | Disposition: A | Discharge status: Home / Self Care | Payer: BC Managed Care – PPO | Source: Ambulatory Visit | Attending: Obstetrics & Gynecology | Admitting: Obstetrics & Gynecology

## 2022-07-23 ENCOUNTER — Ambulatory Visit (INDEPENDENT_AMBULATORY_CARE_PROVIDER_SITE_OTHER): Payer: BC Managed Care – PPO | Admitting: Obstetrics & Gynecology

## 2022-07-23 VITALS — BP 128/79 | HR 72 | Wt 178.6 lb

## 2022-07-23 DIAGNOSIS — Z3A10 10 weeks gestation of pregnancy: Secondary | ICD-10-CM | POA: Diagnosis not present

## 2022-07-23 DIAGNOSIS — O358XX0 Maternal care for other (suspected) fetal abnormality and damage, not applicable or unspecified: Secondary | ICD-10-CM | POA: Diagnosis not present

## 2022-07-23 DIAGNOSIS — Z8759 Personal history of other complications of pregnancy, childbirth and the puerperium: Secondary | ICD-10-CM | POA: Diagnosis not present

## 2022-07-23 DIAGNOSIS — Z3A37 37 weeks gestation of pregnancy: Secondary | ICD-10-CM | POA: Diagnosis not present

## 2022-07-23 DIAGNOSIS — O3680X1 Pregnancy with inconclusive fetal viability, fetus 1: Secondary | ICD-10-CM | POA: Diagnosis not present

## 2022-07-23 DIAGNOSIS — O3680X0 Pregnancy with inconclusive fetal viability, not applicable or unspecified: Secondary | ICD-10-CM

## 2022-07-23 DIAGNOSIS — Z3481 Encounter for supervision of other normal pregnancy, first trimester: Secondary | ICD-10-CM | POA: Insufficient documentation

## 2022-07-23 DIAGNOSIS — Z3401 Encounter for supervision of normal first pregnancy, first trimester: Secondary | ICD-10-CM

## 2022-07-23 DIAGNOSIS — Z3483 Encounter for supervision of other normal pregnancy, third trimester: Secondary | ICD-10-CM | POA: Diagnosis not present

## 2022-07-23 DIAGNOSIS — O09292 Supervision of pregnancy with other poor reproductive or obstetric history, second trimester: Secondary | ICD-10-CM | POA: Diagnosis not present

## 2022-07-23 DIAGNOSIS — Z3A19 19 weeks gestation of pregnancy: Secondary | ICD-10-CM | POA: Diagnosis not present

## 2022-07-23 DIAGNOSIS — Z363 Encounter for antenatal screening for malformations: Secondary | ICD-10-CM | POA: Diagnosis not present

## 2022-07-23 DIAGNOSIS — R030 Elevated blood-pressure reading, without diagnosis of hypertension: Secondary | ICD-10-CM | POA: Diagnosis not present

## 2022-07-23 DIAGNOSIS — O99212 Obesity complicating pregnancy, second trimester: Secondary | ICD-10-CM | POA: Diagnosis not present

## 2022-07-24 ENCOUNTER — Encounter (HOSPITAL_BASED_OUTPATIENT_CLINIC_OR_DEPARTMENT_OTHER): Payer: Self-pay | Admitting: *Deleted

## 2022-07-24 DIAGNOSIS — Z3481 Encounter for supervision of other normal pregnancy, first trimester: Secondary | ICD-10-CM | POA: Diagnosis not present

## 2022-07-24 NOTE — Progress Notes (Signed)
New OB Intake  Pt presents to office today for new OB intake nurse and provider visit  I explained I am completing New OB Intake today. We discussed EDD of 02/12/23 that is based on LMP of 05/08/22. Pt is G2/P1. I reviewed her allergies, medications, Medical/Surgical/OB history, and appropriate screenings. I informed her of [facility] services. Based on history, this is a low risk pregnancy.  Patient Active Problem List   Diagnosis Date Noted   Gestational hypertension 05/10/2019   Premature rupture of membranes 05/09/2019   Vaginal hematoma 05/09/2019    Concerns addressed today  Delivery Plans Plans to deliver at [facility]. Patient given information for [facility] Healthy Baby website for more information about Women's and [place]. Patient is not interested in water birth.   MyChart/Babyscripts MyChart access verified. I explained pt will have some visits in office and some virtually. Babyscripts instructions given and order placed. Patient verifies receipt of registration text/e-mail.  Blood Pressure Cuff/Weight Scale Patient has private insurance; instructed to purchase blood pressure cuff and bring to first prenatal appt. Explained after first prenatal appt pt will check weekly and document in 52. Patient does not have weight scale; patient may purchase if they desire to track weight weekly in Babyscripts.  Anatomy US Explained first scheduled [place] will be around 19 weeks. Anatomy US scheduled for 09/23/22 at 0945. Pt notified to arrive at 0930.  Labs Discussed Johnsie Cancel genetic screening with patient. Would like both Panorama and Horizon drawn at new OB visit. Routine prenatal labs needed.   Social Determinants of Health Food Insecurity: Patient denies food insecurity. Transportation: Patient denies transportation needs. Childcare: Discussed no children allowed at ultrasound appointments.    First visit review I reviewed new OB appt with patient. I explained they will have a  provider visit that includes PAP smear. Explained pt will be seen by Dr.Miller at first visit; encounter routed to appropriate provider.  Blenda Nicely, RN 07/24/2022  9:50 AM

## 2022-07-25 LAB — HIV ANTIBODY (ROUTINE TESTING W REFLEX): HIV Screen 4th Generation wRfx: NONREACTIVE

## 2022-07-25 LAB — RUBELLA SCREEN: Rubella Antibodies, IGG: 2.75 index (ref >0.99)

## 2022-07-25 LAB — HEPATITIS C ANTIBODY: Hep C Virus Ab: NONREACTIVE

## 2022-07-25 LAB — ABO/RH: Rh Factor: POSITIVE

## 2022-07-25 LAB — CBC
Hematocrit: 38.6 % (ref 34.0–46.6)
Hemoglobin: 12.1 g/dL (ref 11.1–15.9)
MCH: 25.1 pg — ABNORMAL LOW (ref 26.6–33.0)
MCHC: 31.3 g/dL — ABNORMAL LOW (ref 31.5–35.7)
MCV: 80 fL (ref 79–97)
Platelets: 277 10*3/uL (ref 150–450)
RBC: 4.83 x10E6/uL (ref 3.77–5.28)
RDW: 14.8 % (ref 11.7–15.4)
WBC: 11.4 10*3/uL — ABNORMAL HIGH (ref 3.4–10.8)

## 2022-07-25 LAB — ANTIBODY SCREEN: Antibody Screen: NEGATIVE

## 2022-07-25 LAB — HEPATITIS B SURFACE ANTIGEN: Hepatitis B Surface Ag: NEGATIVE

## 2022-07-25 LAB — RPR: RPR Ser Ql: NONREACTIVE

## 2022-07-26 ENCOUNTER — Encounter (HOSPITAL_BASED_OUTPATIENT_CLINIC_OR_DEPARTMENT_OTHER): Payer: Self-pay | Admitting: Obstetrics & Gynecology

## 2022-07-26 DIAGNOSIS — Z3401 Encounter for supervision of normal first pregnancy, first trimester: Secondary | ICD-10-CM | POA: Insufficient documentation

## 2022-07-26 DIAGNOSIS — Z8759 Personal history of other complications of pregnancy, childbirth and the puerperium: Secondary | ICD-10-CM | POA: Insufficient documentation

## 2022-07-26 DIAGNOSIS — Z3490 Encounter for supervision of normal pregnancy, unspecified, unspecified trimester: Secondary | ICD-10-CM | POA: Insufficient documentation

## 2022-07-26 LAB — CYTOLOGY - PAP
Chlamydia: NEGATIVE
Comment: NEGATIVE
Comment: NORMAL
Diagnosis: NEGATIVE
Neisseria Gonorrhea: NEGATIVE

## 2022-07-26 LAB — URINE CULTURE

## 2022-07-26 NOTE — Progress Notes (Signed)
History:   Sherry [name] is a 22 y.o. G2P1001 at 22w6dby LMP being seen today for her first obstetrical visit.  Her obstetrical history is significant for  PROM at 38 5/7 weeks.  Did have vaginal hematoma after delivery . Patient does intend to breast feed. Pregnancy history fully reviewed.  Patient reports no complaints.      HISTORY: OB History  Gravida Para Term Preterm AB Living  '2 1 1 '$ 0 0 1  SAB IAB Ectopic Multiple Live Births  0 0 0 0 1    # Outcome Date GA Lbr Len/2nd Weight Sex Delivery Anes PTL Lv  2 Current           1 Term 05/09/19 31w5d7:01 / 00:26 6 lb 9.3 oz (2.985 kg) F Vag-Spont EPI  LIV     Name: Sherry [name], Sherry [name]   Apgar1: 9  Apgar5: 9    Last pap smear will be done today.  No prior result in EPIC.  Past Medical History:  Diagnosis Date   Allergy    seasonal   History of chlamydia    History of gestational hypertension    Past Surgical History:  Procedure Laterality Date   WISDOM TOOTH EXTRACTION     Family History  Problem Relation Age of Onset   Miscarriages / Stillbirths Mother    Hypertension Mother    Stroke Maternal Grandmother    Kidney disease Maternal Grandmother    Hypertension Maternal Grandmother    Hyperlipidemia Maternal Grandmother    Heart disease Maternal Grandmother    Social History   Tobacco Use   Smoking status: Never   Smokeless tobacco: Never  Vaping Use   Vaping Use: Never used  Substance Use Topics   Alcohol use: Not Currently   Drug use: No   No Known Allergies Current Outpatient Medications on File Prior to Visit  Medication Sig Dispense Refill   Doxylamine-Pyridoxine (DICLEGIS) 10-10 MG TBEC Take 1-2 tablets by mouth 4 (four) times daily as needed. START with 2 pills qhs tonight, then during day prn - max dose 4 pills in 24h 60 tablet 0   Prenatal Vit-Fe Fumarate-FA (MULTIVITAMIN-PRENATAL) 27-0.8 MG TABS tablet Take 1 tablet by mouth daily at 12 noon.     No current facility-administered  medications on file prior to visit.    Review of Systems Pertinent items noted in HPI and remainder of comprehensive ROS otherwise negative.  Physical Exam:  There were no vitals filed for this visit.   Ultrasound for viability obtained today.  This is documented separately.    General: well-developed, well-nourished female in no acute distress  Breasts:  normal appearance, no masses or tenderness bilaterally  Skin: normal coloration and turgor, no rashes  Neurologic: oriented, normal, negative, normal mood  Extremities: normal strength, tone, and muscle mass, ROM of all joints is normal  HEENT PERRLA, extraocular movement intact and sclera clear, anicteric  Neck supple and no masses  Cardiovascular: regular rate and rhythm  Respiratory:  no respiratory distress, normal breath sounds  Abdomen: soft, non-tender; bowel sounds normal; no masses,  no organomegaly  Pelvic: normal external genitalia, no lesions, normal vaginal mucosa, normal vaginal discharge, normal cervix, pap smear done. Uterine size:      Assessment:    Pregnancy: G2P1001 Patient Active Problem List   Diagnosis Date Noted   History of gestational hypertension 07/26/2022   Supervision of normal pregnancy 07/26/2022     Plan:  1. Encounter for supervision of other normal pregnancy in first trimester - ABO/Rh - Antibody screen - CBC - Hepatitis B surface antigen - HIV Antibody (routine testing w rflx) - HIV (Save tube for possible reflex) - RPR - Rubella screen - Hepatitis C antibody - Urine Culture - Cytology - PAP( [place]) - Panorama Prenatal Test Full Panel - [place] MFM OB COMP + 93 WK; Future  2. History of gestational hypertension     Initial labs drawn. Continue prenatal vitamins. Problem list reviewed and updated. Genetic Screening discussed, NIPS: ordered. Ultrasound discussed; fetal anatomic survey: ordered. Anticipatory guidance about prenatal visits given including labs,  ultrasounds, and testing. Discussed usage of Babyscripts and virtual visits as additional source of managing and completing prenatal visits in midst of coronavirus and pandemic.   Encouraged to complete MyChart Registration for her ability to review results, send requests, and have questions addressed.  The nature of [place] for [facility] Healthcare/Faculty Practice with multiple MDs and Advanced Practice Providers was explained to patient; also emphasized that residents, students are part of our team. Routine obstetric precautions reviewed. Encouraged to seek out care at office or emergency room [facility] preferred) for urgent and/or emergent concerns. No follow-ups on file.     Felipa Emory, MD, [place], [facility] for [facility], [place]

## 2022-07-31 LAB — PANORAMA PRENATAL TEST FULL PANEL:PANORAMA TEST PLUS 5 ADDITIONAL MICRODELETIONS: FETAL FRACTION: 8.2

## 2022-08-22 ENCOUNTER — Ambulatory Visit (INDEPENDENT_AMBULATORY_CARE_PROVIDER_SITE_OTHER): Payer: BC Managed Care – PPO | Admitting: Obstetrics & Gynecology

## 2022-08-22 VITALS — BP 117/73 | HR 80 | Wt 177.4 lb

## 2022-08-22 DIAGNOSIS — Z3A15 15 weeks gestation of pregnancy: Secondary | ICD-10-CM

## 2022-08-22 DIAGNOSIS — Z3482 Encounter for supervision of other normal pregnancy, second trimester: Secondary | ICD-10-CM | POA: Diagnosis not present

## 2022-08-22 DIAGNOSIS — Z8759 Personal history of other complications of pregnancy, childbirth and the puerperium: Secondary | ICD-10-CM

## 2022-08-22 NOTE — Progress Notes (Signed)
   PRENATAL VISIT NOTE  Subjective:  Sherry [name] is a 22 y.o. G2P1001 at [redacted]w[redacted]d being seen today for ongoing prenatal care.  She is currently monitored for the following issues for this low-risk pregnancy and has History of gestational hypertension and Supervision of normal pregnancy on their problem list.  Patient reports  nausea is improved .  Contractions: Not present. Vag. Bleeding: None.  Movement: Absent. Denies leaking of fluid.   The following portions of the patient's history were reviewed and updated as appropriate: allergies, current medications, past family history, past medical history, past social history, past surgical history and problem list.   Objective:   Vitals:   08/22/22 1625  BP: 117/73  Pulse: 80  Weight: 177 lb 6.4 oz (80.5 kg)    Fetal Status: Fetal Heart Rate (bpm): 147   Movement: Absent     General:  Alert, oriented and cooperative. Patient is in no acute distress.  Skin: Skin is warm and dry. No rash noted.   Cardiovascular: Normal heart rate noted  Respiratory: Normal respiratory effort, no problems with respiration noted  Abdomen: Soft, gravid, appropriate for gestational age.  Pain/Pressure: Absent     Pelvic: Cervical exam deferred        Extremities: Normal range of motion.  Edema: None  Mental Status: Normal mood and affect. Normal behavior. Normal judgment and thought content.   Assessment and Plan:  Pregnancy: G2P1001 at [redacted]w[redacted]d 1. Encounter for supervision of other normal pregnancy in second trimester - On PNV - Anatomy scan scheduled 09/23/2022 - recheck 4 weeks - AFP, Serum, Open Spina Bifida  2. History of gestational hypertension  3. [redacted] weeks gestation of pregnancy  Preterm labor symptoms and general obstetric precautions including but not limited to vaginal bleeding, contractions, leaking of fluid and fetal movement were reviewed in detail with the patient. Please refer to After Visit Summary for other counseling  recommendations.   No follow-ups on file.  Future Appointments  Date Time Provider [place]  09/23/2022  9:45 AM [facility] US5 [facility]  09/23/2022  3:55 PM Megan Salon, MD [facility]  11/18/2022 10:55 AM Megan Salon, MD [facility]  12/09/2022  8:35 AM Megan Salon, MD [facility]  12/23/2022 10:35 AM Megan Salon, MD [facility]  01/06/2023  1:55 PM Megan Salon, MD [facility]  01/20/2023 10:35 AM Megan Salon, MD [facility]  01/28/2023  8:15 AM Megan Salon, MD [facility]  02/03/2023 10:35 AM Megan Salon, MD [facility]  02/10/2023  1:35 PM Megan Salon, MD [facility]  02/17/2023 10:35 AM Megan Salon, MD [facility]    Megan Salon, MD

## 2022-08-24 LAB — AFP, SERUM, OPEN SPINA BIFIDA
AFP MoM: 1.19
AFP Value: 31.8 ng/mL
Gest. Age on Collection Date: 15.1 weeks
Maternal Age At EDD: 24 yr
OSBR Risk 1 IN: 6704
Test Results:: NEGATIVE
Weight: 177 [lb_av]

## 2022-09-23 ENCOUNTER — Ambulatory Visit (INDEPENDENT_AMBULATORY_CARE_PROVIDER_SITE_OTHER): Payer: BC Managed Care – PPO | Admitting: Obstetrics & Gynecology

## 2022-09-23 ENCOUNTER — Ambulatory Visit: Payer: BC Managed Care – PPO | Attending: Obstetrics & Gynecology

## 2022-09-23 ENCOUNTER — Other Ambulatory Visit (HOSPITAL_BASED_OUTPATIENT_CLINIC_OR_DEPARTMENT_OTHER): Payer: Self-pay | Admitting: Obstetrics & Gynecology

## 2022-09-23 ENCOUNTER — Other Ambulatory Visit: Payer: Self-pay | Admitting: *Deleted

## 2022-09-23 VITALS — BP 114/77 | HR 87 | Wt 174.4 lb

## 2022-09-23 DIAGNOSIS — Z3481 Encounter for supervision of other normal pregnancy, first trimester: Secondary | ICD-10-CM

## 2022-09-23 DIAGNOSIS — Z8759 Personal history of other complications of pregnancy, childbirth and the puerperium: Secondary | ICD-10-CM

## 2022-09-23 DIAGNOSIS — Z3402 Encounter for supervision of normal first pregnancy, second trimester: Secondary | ICD-10-CM

## 2022-09-23 DIAGNOSIS — O358XX0 Maternal care for other (suspected) fetal abnormality and damage, not applicable or unspecified: Secondary | ICD-10-CM | POA: Insufficient documentation

## 2022-09-23 DIAGNOSIS — Z3401 Encounter for supervision of normal first pregnancy, first trimester: Secondary | ICD-10-CM | POA: Diagnosis not present

## 2022-09-23 DIAGNOSIS — O09292 Supervision of pregnancy with other poor reproductive or obstetric history, second trimester: Secondary | ICD-10-CM | POA: Insufficient documentation

## 2022-09-23 DIAGNOSIS — Z363 Encounter for antenatal screening for malformations: Secondary | ICD-10-CM | POA: Insufficient documentation

## 2022-09-23 DIAGNOSIS — Z3A19 19 weeks gestation of pregnancy: Secondary | ICD-10-CM | POA: Insufficient documentation

## 2022-09-23 DIAGNOSIS — O99212 Obesity complicating pregnancy, second trimester: Secondary | ICD-10-CM | POA: Diagnosis not present

## 2022-09-23 DIAGNOSIS — O283 Abnormal ultrasonic finding on antenatal screening of mother: Secondary | ICD-10-CM

## 2022-09-23 NOTE — Progress Notes (Signed)
   PRENATAL VISIT NOTE  Subjective:  Sherry [name] is a 22 y.o. G2P1001 at [redacted]w[redacted]d being seen today for ongoing prenatal care.  She is currently monitored for the following issues for this low-risk pregnancy and has History of gestational hypertension and Supervision of normal pregnancy on their problem list.  Patient reports  she thinks she had some food poisoning over the weekend.  Had emesis and then diarrhea.  Feeling better but GI tract not back to normal .  Contractions: Not present. Vag. Bleeding: None.  Movement: Present. Denies leaking of fluid.   The following portions of the patient's history were reviewed and updated as appropriate: allergies, current medications, past family history, past medical history, past social history, past surgical history and problem list.   Objective:   Vitals:   09/23/22 1556  BP: 114/77  Pulse: 87  Weight: 174 lb 6.4 oz (79.1 kg)    Fetal Status: Fetal Heart Rate (bpm): 147 Fundal Height: 20 cm Movement: Present     General:  Alert, oriented and cooperative. Patient is in no acute distress.  Skin: Skin is warm and dry. No rash noted.   Cardiovascular: Normal heart rate noted  Respiratory: Normal respiratory effort, no problems with respiration noted  Abdomen: Soft, gravid, appropriate for gestational age.  Pain/Pressure: Absent     Pelvic: Cervical exam deferred        Extremities: Normal range of motion.  Edema: None  Mental Status: Normal mood and affect. Normal behavior. Normal judgment and thought content.   Assessment and Plan:  Pregnancy: G2P1001 at [redacted]w[redacted]d 1. Encounter for supervision of normal first pregnancy in second trimester - on PNV - recheck 4 weeks - anatomy scan reviewed.  She has follow up anatomy scheduled in 5 weeks  2. [redacted] weeks gestation of pregnancy  3. History of gestational hypertension   Preterm labor symptoms and general obstetric precautions including but not limited to vaginal bleeding, contractions,  leaking of fluid and fetal movement were reviewed in detail with the patient. Please refer to After Visit Summary for other counseling recommendations.   Return in about 4 weeks (around 10/21/2022).  Future Appointments  Date Time Provider Department Center  10/28/2022  2:45 PM [facility] NURSE [facility]  10/28/2022  3:00 PM [facility] US1 [facility]  11/18/2022 10:55 AM Jerene Bears, MD [facility]  12/09/2022  8:35 AM Jerene Bears, MD [facility]  12/23/2022 10:35 AM Jerene Bears, MD [facility]  01/06/2023  1:55 PM Jerene Bears, MD [facility]  01/20/2023 10:35 AM Jerene Bears, MD [facility]  01/28/2023  8:15 AM Jerene Bears, MD [facility]  02/03/2023 10:35 AM Jerene Bears, MD [facility]  02/10/2023  1:35 PM Jerene Bears, MD [facility]  02/17/2023 10:35 AM Jerene Bears, MD [facility]    Jerene Bears, MD

## 2022-10-28 ENCOUNTER — Ambulatory Visit: Payer: BC Managed Care – PPO | Attending: Maternal & Fetal Medicine

## 2022-10-28 ENCOUNTER — Ambulatory Visit: Payer: BC Managed Care – PPO | Admitting: *Deleted

## 2022-10-28 ENCOUNTER — Other Ambulatory Visit: Payer: Self-pay | Admitting: *Deleted

## 2022-10-28 VITALS — BP 122/73 | HR 66

## 2022-10-28 DIAGNOSIS — Z3A24 24 weeks gestation of pregnancy: Secondary | ICD-10-CM | POA: Diagnosis not present

## 2022-10-28 DIAGNOSIS — O09292 Supervision of pregnancy with other poor reproductive or obstetric history, second trimester: Secondary | ICD-10-CM

## 2022-10-28 DIAGNOSIS — Z362 Encounter for other antenatal screening follow-up: Secondary | ICD-10-CM

## 2022-10-28 DIAGNOSIS — Z3689 Encounter for other specified antenatal screening: Secondary | ICD-10-CM

## 2022-10-28 DIAGNOSIS — O283 Abnormal ultrasonic finding on antenatal screening of mother: Secondary | ICD-10-CM | POA: Diagnosis not present

## 2022-10-28 DIAGNOSIS — Z8759 Personal history of other complications of pregnancy, childbirth and the puerperium: Secondary | ICD-10-CM

## 2022-11-18 ENCOUNTER — Encounter (HOSPITAL_BASED_OUTPATIENT_CLINIC_OR_DEPARTMENT_OTHER): Payer: Self-pay | Admitting: Obstetrics & Gynecology

## 2022-11-18 ENCOUNTER — Ambulatory Visit (INDEPENDENT_AMBULATORY_CARE_PROVIDER_SITE_OTHER): Payer: BC Managed Care – PPO | Admitting: Obstetrics & Gynecology

## 2022-11-18 VITALS — BP 129/78 | HR 79 | Wt 183.4 lb

## 2022-11-18 DIAGNOSIS — Z3A27 27 weeks gestation of pregnancy: Secondary | ICD-10-CM | POA: Diagnosis not present

## 2022-11-18 DIAGNOSIS — Z8759 Personal history of other complications of pregnancy, childbirth and the puerperium: Secondary | ICD-10-CM

## 2022-11-18 DIAGNOSIS — Z23 Encounter for immunization: Secondary | ICD-10-CM | POA: Diagnosis not present

## 2022-11-18 DIAGNOSIS — Z3482 Encounter for supervision of other normal pregnancy, second trimester: Secondary | ICD-10-CM

## 2022-11-18 NOTE — Progress Notes (Signed)
   PRENATAL VISIT NOTE  Subjective:  Sherry [name] is a 22 y.o. G2P1001 at [redacted]w[redacted]d being seen today for ongoing prenatal care.  She is currently monitored for the following issues for this low-risk pregnancy and has History of gestational hypertension and Supervision of normal pregnancy on their problem list.  Patient reports no complaints.  Contractions: Not present. Vag. Bleeding: None.  Movement: Present. Denies leaking of fluid.   The following portions of the patient's history were reviewed and updated as appropriate: allergies, current medications, past family history, past medical history, past social history, past surgical history and problem list.   Objective:   Vitals:   11/18/22 0832  BP: 129/78  Pulse: 79  Weight: 183 lb 6.4 oz (83.2 kg)    Fetal Status: Fetal Heart Rate (bpm): 138 Fundal Height: 28 cm Movement: Present     General:  Alert, oriented and cooperative. Patient is in no acute distress.  Skin: Skin is warm and dry. No rash noted.   Cardiovascular: Normal heart rate noted  Respiratory: Normal respiratory effort, no problems with respiration noted  Abdomen: Soft, gravid, appropriate for gestational age.  Pain/Pressure: Absent     Pelvic: Cervical exam deferred        Extremities: Normal range of motion.  Edema: None  Mental Status: Normal mood and affect. Normal behavior. Normal judgment and thought content.   Assessment and Plan:  Pregnancy: G2P1001 at [redacted]w[redacted]d 1. Encounter for supervision of other normal pregnancy in second trimester - Tdap given today - CBC - Glucose Tolerance, 2 Hours w/1 Hour - HIV Antibody (routine testing w rflx) - RPR  2. [redacted] weeks gestation of pregnancy - on PNV  - recheck 4 weeks  3. History of gestational hypertension - pt is checking BPs at home and regularly.  Reviewed in Babyscripts data.  All normal.     Preterm labor symptoms and general obstetric precautions including but not limited to vaginal bleeding,  contractions, leaking of fluid and fetal movement were reviewed in detail with the patient. Please refer to After Visit Summary for other counseling recommendations.   Return in about 4 weeks (around 12/16/2022).  Future Appointments  Date Time Provider Department Center  11/18/2022 10:55 AM Jerene Bears, MD [facility]  12/09/2022  8:35 AM Jerene Bears, MD [facility]  12/23/2022  8:15 AM [facility] NURSE [facility]  12/23/2022  8:30 AM [facility] US2 [facility]  12/23/2022 10:35 AM Jerene Bears, MD [facility]  01/06/2023  1:55 PM Jerene Bears, MD [facility]  01/20/2023 10:35 AM Jerene Bears, MD [facility]  01/28/2023  8:15 AM Jerene Bears, MD [facility]  02/03/2023 10:35 AM Jerene Bears, MD [facility]  02/10/2023  1:35 PM Jerene Bears, MD [facility]  02/17/2023 10:35 AM Jerene Bears, MD [facility]    Jerene Bears, MD

## 2022-11-19 LAB — GLUCOSE TOLERANCE, 2 HOURS W/ 1HR
Glucose, 1 hour: 160 mg/dL (ref 70–179)
Glucose, 2 hour: 141 mg/dL (ref 70–152)
Glucose, Fasting: 79 mg/dL (ref 70–91)

## 2022-11-19 LAB — HIV ANTIBODY (ROUTINE TESTING W REFLEX): HIV Screen 4th Generation wRfx: NONREACTIVE

## 2022-11-19 LAB — CBC
Hematocrit: 36.4 % (ref 34.0–46.6)
Hemoglobin: 11.5 g/dL (ref 11.1–15.9)
MCH: 25.3 pg — ABNORMAL LOW (ref 26.6–33.0)
MCHC: 31.6 g/dL (ref 31.5–35.7)
MCV: 80 fL (ref 79–97)
Platelets: 307 10*3/uL (ref 150–450)
RBC: 4.55 x10E6/uL (ref 3.77–5.28)
RDW: 13.9 % (ref 11.7–15.4)
WBC: 13.2 10*3/uL — ABNORMAL HIGH (ref 3.4–10.8)

## 2022-11-19 LAB — RPR: RPR Ser Ql: NONREACTIVE

## 2022-12-09 ENCOUNTER — Encounter (HOSPITAL_BASED_OUTPATIENT_CLINIC_OR_DEPARTMENT_OTHER): Payer: BC Managed Care – PPO | Admitting: Obstetrics & Gynecology

## 2022-12-10 ENCOUNTER — Ambulatory Visit (INDEPENDENT_AMBULATORY_CARE_PROVIDER_SITE_OTHER): Payer: BC Managed Care – PPO | Admitting: Obstetrics & Gynecology

## 2022-12-10 ENCOUNTER — Encounter (HOSPITAL_BASED_OUTPATIENT_CLINIC_OR_DEPARTMENT_OTHER): Payer: Self-pay | Admitting: Obstetrics & Gynecology

## 2022-12-10 VITALS — BP 119/80 | HR 92 | Wt 191.0 lb

## 2022-12-10 DIAGNOSIS — Z3403 Encounter for supervision of normal first pregnancy, third trimester: Secondary | ICD-10-CM

## 2022-12-10 DIAGNOSIS — Z3A30 30 weeks gestation of pregnancy: Secondary | ICD-10-CM

## 2022-12-10 DIAGNOSIS — Z8759 Personal history of other complications of pregnancy, childbirth and the puerperium: Secondary | ICD-10-CM

## 2022-12-10 NOTE — Progress Notes (Signed)
   PRENATAL VISIT NOTE  Subjective:  Sherry [name] is a 22 y.o. G2P1001 at [redacted]w[redacted]d being seen today for ongoing prenatal care.  She is currently monitored for the following issues for this low-risk pregnancy and has History of gestational hypertension and Supervision of normal pregnancy on their problem list.  Patient reports no complaints.  Contractions: Not present. Vag. Bleeding: None.  Movement: Present. Denies leaking of fluid.   Significant other will move back to this area this next week.  He will based in [place].    The following portions of the patient's history were reviewed and updated as appropriate: allergies, current medications, past family history, past medical history, past social history, past surgical history and problem list.   Objective:   Vitals:   12/10/22 1043  BP: 119/80  Pulse: 92  Weight: 191 lb (86.6 kg)    Fetal Status: Fetal Heart Rate (bpm): 137 Fundal Height: 31 cm Movement: Present     General:  Alert, oriented and cooperative. Patient is in no acute distress.  Skin: Skin is warm and dry. No rash noted.   Cardiovascular: Normal heart rate noted  Respiratory: Normal respiratory effort, no problems with respiration noted  Abdomen: Soft, gravid, appropriate for gestational age.  Pain/Pressure: Absent     Pelvic: Cervical exam deferred        Extremities: Normal range of motion.  Edema: None  Mental Status: Normal mood and affect. Normal behavior. Normal judgment and thought content.   Assessment and Plan:  Pregnancy: G2P1001 at [redacted]w[redacted]d 1. Encounter for supervision of normal first pregnancy in third trimester - on PNV - recheck 2 weeks  2. History of gestational hypertension - has growth scan scheduled for 7/29 due to hx of gestational hypertension  3. [redacted] weeks gestation of pregnancy   Preterm labor symptoms and general obstetric precautions including but not limited to vaginal bleeding, contractions, leaking of fluid and fetal  movement were reviewed in detail with the patient. Please refer to After Visit Summary for other counseling recommendations.   Return in about 2 weeks (around 12/24/2022).  Future Appointments  Date Time Provider Department Center  12/23/2022  8:15 AM [facility] NURSE [facility]  12/23/2022  8:30 AM [facility] US2 [facility]  12/23/2022  1:20 PM Marny Lowenstein, PA-C [facility]  01/06/2023  1:55 PM Jerene Bears, MD [facility]  01/20/2023 10:35 AM Jerene Bears, MD [facility]  01/28/2023  8:15 AM Jerene Bears, MD [facility]  02/03/2023 10:35 AM Jerene Bears, MD [facility]  02/10/2023  1:35 PM Jerene Bears, MD [facility]  02/17/2023 10:35 AM Jerene Bears, MD [facility]    Jerene Bears, MD

## 2022-12-10 NOTE — Progress Notes (Deleted)
   PRENATAL VISIT NOTE  Subjective:  Sherry [name] is a 22 y.o. G2P1001 at [redacted]w[redacted]d being seen today for ongoing prenatal care.  She is currently monitored for the following issues for this {Blank single:19197::"high-risk","low-risk"} pregnancy and has History of gestational hypertension and Supervision of normal pregnancy on their problem list.  Patient reports {sx:14538}.  Contractions: Not present. Vag. Bleeding: None.  Movement: Present. Denies leaking of fluid.   The following portions of the patient's history were reviewed and updated as appropriate: allergies, current medications, past family history, past medical history, past social history, past surgical history and problem list.   Objective:   Vitals:   12/10/22 1043  BP: 119/80  Pulse: 92  Weight: 191 lb (86.6 kg)    Fetal Status: Fetal Heart Rate (bpm): 137   Movement: Present     General:  Alert, oriented and cooperative. Patient is in no acute distress.  Skin: Skin is warm and dry. No rash noted.   Cardiovascular: Normal heart rate noted  Respiratory: Normal respiratory effort, no problems with respiration noted  Abdomen: Soft, gravid, appropriate for gestational age.  Pain/Pressure: Absent     Pelvic: {Blank single:19197::"Cervical exam performed in the presence of a chaperone","Cervical exam deferred"}        Extremities: Normal range of motion.  Edema: None  Mental Status: Normal mood and affect. Normal behavior. Normal judgment and thought content.   Assessment and Plan:  Pregnancy: G2P1001 at [redacted]w[redacted]d There are no diagnoses linked to this encounter. {Blank single:19197::"Term","Preterm"} labor symptoms and general obstetric precautions including but not limited to vaginal bleeding, contractions, leaking of fluid and fetal movement were reviewed in detail with the patient. Please refer to After Visit Summary for other counseling recommendations.   No follow-ups on file.  Future Appointments  Date Time  Provider Department Center  12/23/2022  8:15 AM [facility] NURSE [facility]  12/23/2022  8:30 AM [facility] US2 [facility]  12/23/2022  1:20 PM Marny Lowenstein, PA-C [facility]  01/06/2023  1:55 PM Jerene Bears, MD [facility]  01/20/2023 10:35 AM Jerene Bears, MD [facility]  01/28/2023  8:15 AM Jerene Bears, MD [facility]  02/03/2023 10:35 AM Jerene Bears, MD [facility]  02/10/2023  1:35 PM Jerene Bears, MD [facility]  02/17/2023 10:35 AM Jerene Bears, MD [facility]    Harrie Jeans, RN

## 2022-12-23 ENCOUNTER — Ambulatory Visit: Payer: BC Managed Care – PPO | Admitting: *Deleted

## 2022-12-23 ENCOUNTER — Encounter: Payer: Self-pay | Admitting: *Deleted

## 2022-12-23 ENCOUNTER — Encounter (HOSPITAL_BASED_OUTPATIENT_CLINIC_OR_DEPARTMENT_OTHER): Payer: Self-pay | Admitting: Medical

## 2022-12-23 ENCOUNTER — Ambulatory Visit: Payer: BC Managed Care – PPO | Attending: Obstetrics and Gynecology

## 2022-12-23 ENCOUNTER — Ambulatory Visit (INDEPENDENT_AMBULATORY_CARE_PROVIDER_SITE_OTHER): Payer: BC Managed Care – PPO | Admitting: Medical

## 2022-12-23 VITALS — BP 127/79 | HR 78 | Wt 191.2 lb

## 2022-12-23 DIAGNOSIS — O35BXX0 Maternal care for other (suspected) fetal abnormality and damage, fetal cardiac anomalies, not applicable or unspecified: Secondary | ICD-10-CM | POA: Diagnosis not present

## 2022-12-23 DIAGNOSIS — Z3689 Encounter for other specified antenatal screening: Secondary | ICD-10-CM | POA: Insufficient documentation

## 2022-12-23 DIAGNOSIS — E669 Obesity, unspecified: Secondary | ICD-10-CM

## 2022-12-23 DIAGNOSIS — O09293 Supervision of pregnancy with other poor reproductive or obstetric history, third trimester: Secondary | ICD-10-CM

## 2022-12-23 DIAGNOSIS — Z8759 Personal history of other complications of pregnancy, childbirth and the puerperium: Secondary | ICD-10-CM

## 2022-12-23 DIAGNOSIS — Z3403 Encounter for supervision of normal first pregnancy, third trimester: Secondary | ICD-10-CM

## 2022-12-23 DIAGNOSIS — O99213 Obesity complicating pregnancy, third trimester: Secondary | ICD-10-CM

## 2022-12-23 DIAGNOSIS — Z362 Encounter for other antenatal screening follow-up: Secondary | ICD-10-CM | POA: Diagnosis not present

## 2022-12-23 DIAGNOSIS — Z3A32 32 weeks gestation of pregnancy: Secondary | ICD-10-CM

## 2022-12-23 NOTE — Progress Notes (Signed)
   PRENATAL VISIT NOTE  Subjective:  Sherry [name] is a 22 y.o. G2P1001 at [redacted]w[redacted]d being seen today for ongoing prenatal care.  She is currently monitored for the following issues for this low-risk pregnancy and has History of gestational hypertension and Supervision of normal pregnancy on their problem list.  Patient reports no complaints.  Contractions: Not present. Vag. Bleeding: None.  Movement: Present. Denies leaking of fluid.   The following portions of the patient's history were reviewed and updated as appropriate: allergies, current medications, past family history, past medical history, past social history, past surgical history and problem list.   Objective:   Vitals:   12/23/22 1334  BP: 127/79  Pulse: 78  Weight: 191 lb 3.2 oz (86.7 kg)    Fetal Status: Fetal Heart Rate (bpm): 142 Fundal Height: 32 cm Movement: Present     General:  Alert, oriented and cooperative. Patient is in no acute distress.  Skin: Skin is warm and dry. No rash noted.   Cardiovascular: Normal heart rate noted  Respiratory: Normal respiratory effort, no problems with respiration noted  Abdomen: Soft, gravid, appropriate for gestational age.  Pain/Pressure: Present     Pelvic: Cervical exam deferred        Extremities: Normal range of motion.  Edema: Trace (Feet when walking alot)  Mental Status: Normal mood and affect. Normal behavior. Normal judgment and thought content.   Assessment and Plan:  Pregnancy: G2P1001 at [redacted]w[redacted]d 1. Encounter for supervision of normal first pregnancy in third trimester - [place] from MFM this morning reviewed   2. History of gestational hypertension - normotensive today, reviewed concerning s/s for HTN   3. [redacted] weeks gestation of pregnancy  Preterm labor symptoms and general obstetric precautions including but not limited to vaginal bleeding, contractions, leaking of fluid and fetal movement were reviewed in detail with the patient. Please refer to After Visit  Summary for other counseling recommendations.   Return in about 2 weeks (around 01/06/2023) for LOB, as scheduled.  Future Appointments  Date Time Provider Department Center  01/06/2023  1:55 PM Jerene Bears, MD [facility]  01/20/2023 10:35 AM Jerene Bears, MD [facility]  01/28/2023  8:15 AM Hurshel Party, CNM [facility]  02/03/2023 10:35 AM Jerene Bears, MD [facility]  02/10/2023  1:35 PM Jerene Bears, MD [facility]  02/17/2023 10:35 AM Jerene Bears, MD [facility]    Vonzella Nipple, PA-C

## 2022-12-25 ENCOUNTER — Telehealth (HOSPITAL_BASED_OUTPATIENT_CLINIC_OR_DEPARTMENT_OTHER): Payer: Self-pay | Admitting: *Deleted

## 2022-12-25 NOTE — Telephone Encounter (Signed)
Pt called stating that she has had some nausea and vomiting since Monday evening. She has not taken any medication for this. She is starting to feel better today and is able to eat lunch. She reports good urine output. She denies contractions, bleeding, and reports good fetal movement. Advised [facility] to [facility] if she becomes unable to keep any food/fluids down, stops urinating, notices decreased movement or contractions, bleeding. Pt verbalized understanding.

## 2023-01-06 ENCOUNTER — Telehealth (HOSPITAL_BASED_OUTPATIENT_CLINIC_OR_DEPARTMENT_OTHER): Payer: BC Managed Care – PPO | Admitting: Obstetrics & Gynecology

## 2023-01-06 VITALS — Wt 192.0 lb

## 2023-01-06 DIAGNOSIS — Z8759 Personal history of other complications of pregnancy, childbirth and the puerperium: Secondary | ICD-10-CM

## 2023-01-06 DIAGNOSIS — Z3A34 34 weeks gestation of pregnancy: Secondary | ICD-10-CM

## 2023-01-06 DIAGNOSIS — O09293 Supervision of pregnancy with other poor reproductive or obstetric history, third trimester: Secondary | ICD-10-CM

## 2023-01-06 DIAGNOSIS — Z3403 Encounter for supervision of normal first pregnancy, third trimester: Secondary | ICD-10-CM

## 2023-01-06 NOTE — Progress Notes (Signed)
I connected with Sherry [name] 01/06/23 at  1:55 PM EDT by: MyChart video and verified that I am speaking with the correct person using two identifiers.  [place] and provider is located at office.     I discussed the limitations, risks, security and privacy concerns of performing an evaluation and management service by MyChart video and the availability of in person appointments. I also discussed with the patient that there may be a patient responsible charge related to this service. By engaging in this virtual visit, you consent to the provision of healthcare.  Additionally, you authorize for your insurance to be billed for the services provided during this visit.  The patient expressed understanding and agreed to proceed.  The following staff members participated in the virtual visit:  Ina Homes, CMA    PRENATAL VISIT NOTE  Subjective:  Sherry [name] is a 22 y.o. G2P1001 at [redacted]w[redacted]d  for virtual visit for ongoing prenatal care.  She is currently monitored for the following issues for this low-risk pregnancy and has History of gestational hypertension and Supervision of normal pregnancy on their problem list.  Patient reports no complaints.  Contractions: Not present. Vag. Bleeding: None.  Movement: Present. Denies leaking of fluid.   She had a growth scan on 12/23/2022 due to BMI 31.  EFW was 51%.    The following portions of the patient's history were reviewed and updated as appropriate: allergies, current medications, past family history, past medical history, past social history, past surgical history and problem list.   Objective:   Vitals:   01/06/23 1351  Weight: 192 lb (87.1 kg)   Self-Obtained  Fetal Status:     Movement: Present     Assessment and Plan:  Pregnancy: G2P1001 at [redacted]w[redacted]d 1. Encounter for supervision of normal first pregnancy in third trimester - on PNV - recheck 2 weeks  2. [redacted] weeks gestation of  pregnancy  3. History of gestational hypertension  4.  BMI 31 - growth scan on 7/29 was normal.  No additional follow up was recommended.  Preterm labor symptoms and general obstetric precautions including but not limited to vaginal bleeding, contractions, leaking of fluid and fetal movement were reviewed in detail with the patient.  Return in about 2 weeks (around 01/20/2023).  Future Appointments  Date Time Provider Department Center  01/20/2023 10:35 AM Jerene Bears, MD [facility]  01/28/2023  8:15 AM Hurshel Party, CNM [facility]  02/03/2023 10:35 AM Jerene Bears, MD [facility]  02/10/2023  1:35 PM Jerene Bears, MD [facility]  02/17/2023 10:35 AM Jerene Bears, MD [facility]    Time spent on virtual visit: 8 minutes  Jerene Bears, MD

## 2023-01-20 ENCOUNTER — Other Ambulatory Visit (HOSPITAL_COMMUNITY)
Admission: RE | Admit: 2023-01-20 | Discharge: 2023-01-20 | Disposition: A | Discharge status: Home / Self Care | Payer: BC Managed Care – PPO | Source: Ambulatory Visit | Attending: Obstetrics & Gynecology | Admitting: Obstetrics & Gynecology

## 2023-01-20 ENCOUNTER — Encounter (HOSPITAL_BASED_OUTPATIENT_CLINIC_OR_DEPARTMENT_OTHER): Payer: Self-pay | Admitting: Obstetrics & Gynecology

## 2023-01-20 ENCOUNTER — Ambulatory Visit (INDEPENDENT_AMBULATORY_CARE_PROVIDER_SITE_OTHER): Payer: BC Managed Care – PPO | Admitting: Obstetrics & Gynecology

## 2023-01-20 VITALS — BP 98/77 | HR 92 | Wt 188.0 lb

## 2023-01-20 DIAGNOSIS — Z3483 Encounter for supervision of other normal pregnancy, third trimester: Secondary | ICD-10-CM | POA: Diagnosis not present

## 2023-01-20 DIAGNOSIS — Z8759 Personal history of other complications of pregnancy, childbirth and the puerperium: Secondary | ICD-10-CM

## 2023-01-20 DIAGNOSIS — Z3A36 36 weeks gestation of pregnancy: Secondary | ICD-10-CM | POA: Diagnosis not present

## 2023-01-20 DIAGNOSIS — O283 Abnormal ultrasonic finding on antenatal screening of mother: Secondary | ICD-10-CM | POA: Insufficient documentation

## 2023-01-20 NOTE — Progress Notes (Signed)
   PRENATAL VISIT NOTE  Subjective:  Sherry [name] is a 22 y.o. G2P1001 at [redacted]w[redacted]d being seen today for ongoing prenatal care.  She is currently monitored for the following issues for this low-risk pregnancy and has History of gestational hypertension; Supervision of normal pregnancy; and Echogenic intracardiac focus of fetus on prenatal ultrasound on their problem list.  Patient reports no complaints.  Contractions: Not present. Vag. Bleeding: None.  Movement: Present. Denies leaking of fluid.   The following portions of the patient's history were reviewed and updated as appropriate: allergies, current medications, past family history, past medical history, past social history, past surgical history and problem list.   Objective:   Vitals:   01/20/23 1048  BP: 98/77  Pulse: 92  Weight: 188 lb (85.3 kg)    Fetal Status: Fetal Heart Rate (bpm): 142 Fundal Height: 35 cm Movement: Present  Presentation: Vertex  General:  Alert, oriented and cooperative. Patient is in no acute distress.  Skin: Skin is warm and dry. No rash noted.   Cardiovascular: Normal heart rate noted  Respiratory: Normal respiratory effort, no problems with respiration noted  Abdomen: Soft, gravid, appropriate for gestational age.  Pain/Pressure: Present     Pelvic: Cervical exam performed in the presence of a chaperone Dilation: 1.5 Effacement (%): 30 Station: -3  Extremities: Normal range of motion.  Edema: None  Mental Status: Normal mood and affect. Normal behavior. Normal judgment and thought content.   Assessment and Plan:  Pregnancy: G2P1001 at [redacted]w[redacted]d 1. Encounter for supervision of other normal pregnancy in third trimester - on PNV - recheck 1 week - Culture, beta strep (group b only) - Cervicovaginal ancillary only( [place])  2. History of gestational hypertension - pt monitoring BPs as home and putting into [employer] (but did not this past week)  3. [redacted] weeks gestation of  pregnancy  4. Echogenic intracardiac focus of fetus on prenatal ultrasound - normal variant per MFM  Preterm labor symptoms and general obstetric precautions including but not limited to vaginal bleeding, contractions, leaking of fluid and fetal movement were reviewed in detail with the patient. Please refer to After Visit Summary for other counseling recommendations.   Return in about 1 week (around 01/27/2023).  Future Appointments  Date Time Provider Department Center  01/28/2023  8:15 AM Hurshel Party, CNM [facility]  02/03/2023 10:35 AM Jerene Bears, MD [facility]  02/10/2023  1:35 PM Jerene Bears, MD [facility]  02/17/2023 10:35 AM Jerene Bears, MD [facility]    Jerene Bears, MD

## 2023-01-22 LAB — CERVICOVAGINAL ANCILLARY ONLY
Chlamydia: NEGATIVE
Comment: NEGATIVE
Comment: NORMAL
Neisseria Gonorrhea: NEGATIVE

## 2023-01-23 LAB — CULTURE, BETA STREP (GROUP B ONLY): Strep Gp B Culture: NEGATIVE

## 2023-01-28 ENCOUNTER — Ambulatory Visit (HOSPITAL_BASED_OUTPATIENT_CLINIC_OR_DEPARTMENT_OTHER): Payer: BC Managed Care – PPO

## 2023-01-28 ENCOUNTER — Telehealth (INDEPENDENT_AMBULATORY_CARE_PROVIDER_SITE_OTHER): Payer: BC Managed Care – PPO | Admitting: Advanced Practice Midwife

## 2023-01-28 VITALS — BP 117/83 | HR 76

## 2023-01-28 DIAGNOSIS — Z3483 Encounter for supervision of other normal pregnancy, third trimester: Secondary | ICD-10-CM

## 2023-01-28 DIAGNOSIS — Z8759 Personal history of other complications of pregnancy, childbirth and the puerperium: Secondary | ICD-10-CM

## 2023-01-28 DIAGNOSIS — Z3A37 37 weeks gestation of pregnancy: Secondary | ICD-10-CM

## 2023-01-28 DIAGNOSIS — R030 Elevated blood-pressure reading, without diagnosis of hypertension: Secondary | ICD-10-CM

## 2023-01-28 NOTE — Progress Notes (Signed)
Patient came in today to have blood pressure checked. Patient was given a blood pressure machine per Lisa/Kim to check blood pressure at home. Patient was encouraged by Misty Stanley to put readings in on the baby script app. tbw

## 2023-01-28 NOTE — Progress Notes (Signed)
   OBSTETRICS PRENATAL VIRTUAL VISIT ENCOUNTER NOTE  Provider location: [facility]   Patient location: Home  I connected with [name] on 01/28/23 at  8:15 AM EDT by MyChart Video Encounter and verified that I am speaking with the correct person using two identifiers. I discussed the limitations, risks, security and privacy concerns of performing an evaluation and management service virtually and the availability of in person appointments. I also discussed with the patient that there may be a patient responsible charge related to this service. The patient expressed understanding and agreed to proceed. Subjective:  Sherry [name] is a 22 y.o. G2P1001 at [redacted]w[redacted]d being seen today for ongoing prenatal care.  She is currently monitored for the following issues for this low-risk pregnancy and has History of gestational hypertension; Supervision of normal pregnancy; and Echogenic intracardiac focus of fetus on prenatal ultrasound on their problem list.  Patient reports  BPs elevated on home cuff today .   . Vag. Bleeding: None.  Movement: Present. Denies any leaking of fluid.   The following portions of the patient's history were reviewed and updated as appropriate: allergies, current medications, past family history, past medical history, past social history, past surgical history and problem list.   Objective:  There were no vitals filed for this visit.  Fetal Status:     Movement: Present     General:  Alert, oriented and cooperative. Patient is in no acute distress.  Respiratory: Normal respiratory effort, no problems with respiration noted  Mental Status: Normal mood and affect. Normal behavior. Normal judgment and thought content.  Rest of physical exam deferred due to type of encounter  Imaging: No results found.  Assessment and Plan:  Pregnancy: G2P1001 at [redacted]w[redacted]d 1. Encounter for supervision of other normal pregnancy in third  trimester --Pt reports good fetal movement, denies cramping, LOF, or vaginal bleeding  --Anticipatory guidance about next visits/weeks of pregnancy given.   2. History of gestational hypertension   3. Elevated blood pressure reading without diagnosis of hypertension --BP elevated, 140s/90s on her mom's wrist cuff.  No s/sx of PEC. --Pt to come to office today for BP check --S/sx of PEC reviewed  4. [redacted] weeks gestation of pregnancy   Term labor symptoms and general obstetric precautions including but not limited to vaginal bleeding, contractions, leaking of fluid and fetal movement were reviewed in detail with the patient. I discussed the assessment and treatment plan with the patient. The patient was provided an opportunity to ask questions and all were answered. The patient agreed with the plan and demonstrated an understanding of the instructions. The patient was advised to call back or seek an in-person office evaluation/go to [facility] at [facility] for any urgent or concerning symptoms. Please refer to After Visit Summary for other counseling recommendations.   I provided 10 minutes of face-to-face time during this encounter.  No follow-ups on file.  Future Appointments  Date Time Provider Department Center  02/03/2023 10:35 AM Jerene Bears, MD [facility]  02/10/2023  1:35 PM Jerene Bears, MD [facility]  02/17/2023 10:35 AM Jerene Bears, MD [facility]    Sharen Counter, CNM [facility], [facility]

## 2023-02-03 ENCOUNTER — Encounter (HOSPITAL_BASED_OUTPATIENT_CLINIC_OR_DEPARTMENT_OTHER): Payer: BC Managed Care – PPO | Admitting: Obstetrics & Gynecology

## 2023-02-04 ENCOUNTER — Encounter (HOSPITAL_COMMUNITY): Payer: Self-pay | Admitting: Obstetrics & Gynecology

## 2023-02-04 ENCOUNTER — Telehealth (HOSPITAL_BASED_OUTPATIENT_CLINIC_OR_DEPARTMENT_OTHER): Payer: Self-pay | Admitting: *Deleted

## 2023-02-04 ENCOUNTER — Other Ambulatory Visit: Payer: Self-pay

## 2023-02-04 ENCOUNTER — Inpatient Hospital Stay (HOSPITAL_COMMUNITY)
Admission: AD | Admit: 2023-02-04 | Discharge: 2023-02-06 | DRG: 807 | Disposition: A | Discharge status: Home / Self Care | Payer: BC Managed Care – PPO | Attending: Obstetrics & Gynecology | Admitting: Obstetrics & Gynecology

## 2023-02-04 DIAGNOSIS — O134 Gestational [pregnancy-induced] hypertension without significant proteinuria, complicating childbirth: Secondary | ICD-10-CM | POA: Diagnosis not present

## 2023-02-04 DIAGNOSIS — Z8249 Family history of ischemic heart disease and other diseases of the circulatory system: Secondary | ICD-10-CM

## 2023-02-04 DIAGNOSIS — Z83438 Family history of other disorder of lipoprotein metabolism and other lipidemia: Secondary | ICD-10-CM

## 2023-02-04 DIAGNOSIS — Z23 Encounter for immunization: Secondary | ICD-10-CM

## 2023-02-04 DIAGNOSIS — R030 Elevated blood-pressure reading, without diagnosis of hypertension: Secondary | ICD-10-CM | POA: Diagnosis not present

## 2023-02-04 DIAGNOSIS — Z823 Family history of stroke: Secondary | ICD-10-CM | POA: Diagnosis not present

## 2023-02-04 DIAGNOSIS — Z3403 Encounter for supervision of normal first pregnancy, third trimester: Secondary | ICD-10-CM

## 2023-02-04 DIAGNOSIS — Z3A38 38 weeks gestation of pregnancy: Secondary | ICD-10-CM

## 2023-02-04 DIAGNOSIS — Z3490 Encounter for supervision of normal pregnancy, unspecified, unspecified trimester: Secondary | ICD-10-CM

## 2023-02-04 DIAGNOSIS — O283 Abnormal ultrasonic finding on antenatal screening of mother: Secondary | ICD-10-CM | POA: Diagnosis present

## 2023-02-04 DIAGNOSIS — Z8759 Personal history of other complications of pregnancy, childbirth and the puerperium: Secondary | ICD-10-CM

## 2023-02-04 DIAGNOSIS — O26893 Other specified pregnancy related conditions, third trimester: Principal | ICD-10-CM | POA: Diagnosis present

## 2023-02-04 DIAGNOSIS — Z2981 Encounter for HIV pre-exposure prophylaxis: Secondary | ICD-10-CM

## 2023-02-04 DIAGNOSIS — Z3493 Encounter for supervision of normal pregnancy, unspecified, third trimester: Secondary | ICD-10-CM

## 2023-02-04 LAB — TYPE AND SCREEN
ABO/RH(D): O POS
Antibody Screen: NEGATIVE

## 2023-02-04 LAB — CBC
HCT: 35.6 % — ABNORMAL LOW (ref 36.0–46.0)
Hemoglobin: 11.5 g/dL — ABNORMAL LOW (ref 12.0–15.0)
MCH: 24 pg — ABNORMAL LOW (ref 26.0–34.0)
MCHC: 32.3 g/dL (ref 30.0–36.0)
MCV: 74.3 fL — ABNORMAL LOW (ref 80.0–100.0)
Platelets: 306 10*3/uL (ref 150–400)
RBC: 4.79 MIL/uL (ref 3.87–5.11)
RDW: 15.2 % (ref 11.5–15.5)
WBC: 13.3 10*3/uL — ABNORMAL HIGH (ref 4.0–10.5)
nRBC: 0 % (ref 0.0–0.2)

## 2023-02-04 LAB — RUPTURE OF MEMBRANE (ROM)PLUS: Rom Plus: NEGATIVE

## 2023-02-04 MED ORDER — LACTATED RINGERS IV SOLN: INTRAVENOUS | Status: DC

## 2023-02-04 MED ORDER — OXYTOCIN BOLUS FROM INFUSION
333.0000 mL | Freq: Once | INTRAVENOUS | Status: AC
  Administered 2023-02-04: 333 mL via INTRAVENOUS

## 2023-02-04 MED ORDER — DIBUCAINE (PERIANAL) 1 % EX OINT: 1.0000 | TOPICAL_OINTMENT | CUTANEOUS | Status: DC | PRN

## 2023-02-04 MED ORDER — ACETAMINOPHEN 325 MG PO TABS: 650.0000 mg | ORAL_TABLET | ORAL | Status: DC | PRN

## 2023-02-04 MED ORDER — TETANUS-DIPHTH-ACELL PERTUSSIS 5-2.5-18.5 LF-MCG/0.5 IM SUSY: 0.5000 mL | PREFILLED_SYRINGE | Freq: Once | INTRAMUSCULAR | Status: DC

## 2023-02-04 MED ORDER — ONDANSETRON HCL 4 MG/2ML IJ SOLN: 4.0000 mg | Freq: Four times a day (QID) | INTRAMUSCULAR | Status: DC | PRN

## 2023-02-04 MED ORDER — WITCH HAZEL-GLYCERIN EX PADS: 1.0000 | MEDICATED_PAD | CUTANEOUS | Status: DC | PRN

## 2023-02-04 MED ORDER — ONDANSETRON HCL 4 MG PO TABS: 4.0000 mg | ORAL_TABLET | ORAL | Status: DC | PRN

## 2023-02-04 MED ORDER — LACTATED RINGERS IV SOLN: 500.0000 mL | INTRAVENOUS | Status: DC | PRN

## 2023-02-04 MED ORDER — ONDANSETRON HCL 4 MG/2ML IJ SOLN: 4.0000 mg | INTRAMUSCULAR | Status: DC | PRN

## 2023-02-04 MED ORDER — FENTANYL CITRATE (PF) 100 MCG/2ML IJ SOLN
50.0000 ug | INTRAMUSCULAR | Status: AC | PRN
  Administered 2023-02-04: 50 ug via INTRAVENOUS
  Filled 2023-02-04: qty 2

## 2023-02-04 MED ORDER — OXYTOCIN-SODIUM CHLORIDE 30-0.9 UT/500ML-% IV SOLN
2.5000 [IU]/h | INTRAVENOUS | Status: DC
  Filled 2023-02-04: qty 500

## 2023-02-04 MED ORDER — PRENATAL MULTIVITAMIN CH
1.0000 | ORAL_TABLET | Freq: Every day | ORAL | Status: DC
  Administered 2023-02-05 – 2023-02-06 (×2): 1 via ORAL
  Filled 2023-02-04 (×2): qty 1

## 2023-02-04 MED ORDER — BENZOCAINE-MENTHOL 20-0.5 % EX AERO: 1.0000 | INHALATION_SPRAY | CUTANEOUS | Status: DC | PRN

## 2023-02-04 MED ORDER — SENNOSIDES-DOCUSATE SODIUM 8.6-50 MG PO TABS
2.0000 | ORAL_TABLET | Freq: Every day | ORAL | Status: DC
  Administered 2023-02-05 – 2023-02-06 (×2): 2 via ORAL
  Filled 2023-02-04 (×2): qty 2

## 2023-02-04 MED ORDER — SOD CITRATE-CITRIC ACID 500-334 MG/5ML PO SOLN: 30.0000 mL | ORAL | Status: DC | PRN

## 2023-02-04 MED ORDER — DIPHENHYDRAMINE HCL 25 MG PO CAPS: 25.0000 mg | ORAL_CAPSULE | Freq: Four times a day (QID) | ORAL | Status: DC | PRN

## 2023-02-04 MED ORDER — LIDOCAINE HCL (PF) 1 % IJ SOLN
30.0000 mL | INTRAMUSCULAR | Status: AC | PRN
  Administered 2023-02-04: 30 mL via SUBCUTANEOUS
  Filled 2023-02-04: qty 30

## 2023-02-04 MED ORDER — ZOLPIDEM TARTRATE 5 MG PO TABS: 5.0000 mg | ORAL_TABLET | Freq: Every evening | ORAL | Status: DC | PRN

## 2023-02-04 MED ORDER — COCONUT OIL OIL: 1.0000 | TOPICAL_OIL | Status: DC | PRN

## 2023-02-04 MED ORDER — SIMETHICONE 80 MG PO CHEW: 80.0000 mg | CHEWABLE_TABLET | ORAL | Status: DC | PRN

## 2023-02-04 MED ORDER — IBUPROFEN 600 MG PO TABS
600.0000 mg | ORAL_TABLET | Freq: Four times a day (QID) | ORAL | Status: DC
  Administered 2023-02-05 – 2023-02-06 (×3): 600 mg via ORAL
  Filled 2023-02-04 (×3): qty 1

## 2023-02-04 NOTE — H&P (Cosign Needed Addendum)
OBSTETRIC ADMISSION HISTORY AND PHYSICAL  Sherry [name] is a 22 y.o. female G2P1001 with IUP at [redacted]w[redacted]d by LMP presenting for SOL. She reports +FMs, No LOF, no VB, no blurry vision, headaches or peripheral edema, and RUQ pain.  She plans on breast feeding. She request POPs for birth control. She received her prenatal care at  [facility]    Dating: By LMP equal to [redacted]w[redacted]d [place] --->  Estimated Date of Delivery: 02/12/23  Sono:    @[redacted]w[redacted]d , CWD, normal anatomy, normal presentation,  2109g, 51% EFW   Prenatal History/Complications:   Hx of gestational HTN with elevated BP's at [redacted] weeks gestation  Past Medical History: Past Medical History:  Diagnosis Date   Allergy    seasonal   History of chlamydia 05/02/2016   History of gestational hypertension     Past Surgical History: Past Surgical History:  Procedure Laterality Date   WISDOM TOOTH EXTRACTION      Obstetrical History: OB History     Gravida  2   Para  1   Term  1   Preterm  0   AB  0   Living  1      SAB  0   IAB  0   Ectopic  0   Multiple  0   Live Births  1           Social History Social History   Socioeconomic History   Marital status: Single    Spouse name: n/a   Number of children: 2   Years of education: Not on file   Highest education level: Not on file  Occupational History   Occupation: student    Comment: [employer]  Tobacco Use   Smoking status: Never   Smokeless tobacco: Never  Vaping Use   Vaping status: Never Used  Substance and Sexual Activity   Alcohol use: Not Currently   Drug use: No   Sexual activity: Yes    Birth control/protection: None  Other Topics Concern   Not on file  Social History Narrative   Not on file   Social Determinants of Health   Financial Resource Strain: Low Risk  (07/23/2022)   Overall Financial Resource Strain (CARDIA)    Difficulty of Paying Living Expenses: Not very hard  Food Insecurity: No Food Insecurity (07/23/2022)    Hunger Vital Sign    Worried About Running Out of Food in the Last Year: Never true    Ran Out of Food in the Last Year: Never true  Transportation Needs: No Transportation Needs (07/23/2022)   PRAPARE - Administrator, Civil Service (Medical): No    Lack of Transportation (Non-Medical): No  Physical Activity: Insufficiently Active (07/23/2022)   Exercise Vital Sign    Days of Exercise per Week: 5 days    Minutes of Exercise per Session: 20 [name]  Stress: Stress Concern Present (07/23/2022)   [employer] of Occupational Health - Occupational Stress Questionnaire    Feeling of Stress : To some extent  Social Connections: Moderately Isolated (07/23/2022)   Social Connection and Isolation Panel [NHANES]    Frequency of Communication with Friends and Family: More than three times a week    Frequency of Social Gatherings with Friends and Family: Twice a week    Attends Religious Services: 1 to 4 times per year    Active Member of [employer] or Organizations: No    Attends Banker Meetings: Never  Marital Status: Never married    Family History: Family History  Problem Relation Age of Onset   Miscarriages / Stillbirths Mother    Hypertension Mother    Stroke Maternal Grandmother    Kidney disease Maternal Grandmother    Hypertension Maternal Grandmother    Hyperlipidemia Maternal Grandmother    Heart disease Maternal Grandmother     Allergies: No Known Allergies  Medications Prior to Admission  Medication Sig Dispense Refill Last Dose   Prenatal Vit-Fe Fumarate-FA (MULTIVITAMIN-PRENATAL) 27-0.8 MG TABS tablet Take 1 tablet by mouth daily at 12 noon.   02/03/2023     Review of Systems   All systems reviewed and negative except as stated in HPI  Blood pressure 128/83, pulse 80, temperature 98.8 F (37.1 C), temperature source Oral, resp. rate 18, last menstrual period 05/08/2022, SpO2 96%, unknown if currently breastfeeding. General appearance: alert,  cooperative, and appears stated age Lungs: clear to auscultation bilaterally Heart: regular rate and rhythm Abdomen: soft, non-tender; bowel sounds normal Pelvic: 4/50/-2 Extremities: Homans sign is negative, no sign of DVT DTR's intact Presentation: cephalic Fetal monitoringBaseline: 130 bpm, Variability: Good {> 6 bpm), Accelerations: Reactive, and Decelerations: Absent Uterine activityFrequency: Every 5-6 minutes Dilation: 4 Effacement (%): 50 Station: -2 Exam by:: Dr. Lucianne Muss   Prenatal labs: ABO, Rh: O/Positive/-- (02/28 0817) Antibody: Negative (02/28 0817) Rubella: 2.75 (02/28 0817) RPR: Non Reactive (06/24 0827)  HBsAg: Negative (02/28 0817)  HIV: Non Reactive (06/24 0827)  GBS: Negative/-- (08/26 1138)  2 hr Glucola passed Genetic screening  NIPS: LR/Female, AFP: Negative Anatomy US Normal  Prenatal Transfer Tool  Maternal Diabetes: No Genetic Screening: Normal Maternal Ultrasounds/Referrals: Normal Fetal Ultrasounds or other Referrals:  None Maternal Substance Abuse:  No Significant Maternal Medications:  None Significant Maternal Lab Results:  Group B Strep negative Number of Prenatal Visits:greater than 3 verified prenatal visits Other Comments:  None  Results for orders placed or performed during the hospital encounter of 02/04/23 (from the past 24 hour(s))  Rupture of Membrane (ROM) Plus   Collection Time: 02/04/23  3:54 PM  Result Value Ref Range   Rom Plus NEGATIVE     Patient Active Problem List   Diagnosis Date Noted   Elevated blood pressure reading without diagnosis of hypertension 01/28/2023   Echogenic intracardiac focus of fetus on prenatal ultrasound 01/20/2023   History of gestational hypertension 07/26/2022   Supervision of normal pregnancy 07/26/2022    Assessment/Plan:  Sherry [name] is a 22 y.o. G2P1001 at [redacted]w[redacted]d here for SOL.  #Gestation HTN - did have one episode of HTN outpatient at office visit on 09/03. BP upon admission  120's/80's. Continue to monitor.   #Labor:Patient is progressing well in the MAU. Will consider augmentation with pitocin and AROM if required.  #Pain: IV pain medication, considering epidural #FWB: Moderate variability, accelerations present, decelerations absent, Cat I tracing #ID:  GBS negative #MOF: Breast #MOC: POP's #Circ:  Desires circumcision   Moody Bruins, MD  02/04/2023, 5:59 PM  GME ATTESTATION:  Evaluation and management procedures were performed by the [facility] Medicine Resident under my supervision. I was immediately available for direct supervision, assistance and direction throughout this encounter.  I also confirm that I have verified the information documented in the resident's note, and that I have also personally reperformed the pertinent components of the physical exam and all of the medical decision making activities.  I have also made any necessary editorial changes.  Wyn Forster, MD OB Fellow, Faculty  [facility] [place] 02/05/2023 10:27 AM

## 2023-02-04 NOTE — Lactation Note (Signed)
This note was copied from a baby's chart. Lactation Consultation Note  Patient Name: Sherry [name] [id number] Date: 02/04/2023 Age:24 hours Reason for consult: Initial assessment;Early term 37-38.6wks. Birth Parent latched infant on her left breast using the football hold, infant sustained latch and breastfeed, skin to skin for 10 minutes. Birth Parent will continue to BF infant by cues, on demand, every 2-3 hours, skin to skin. Birth Parent knows to call RN/LC for latch assistance if needed. LC discussed importance of maternal rest, diet and hydration. Birth Parent was  made aware of O/P services, breastfeeding support groups, community resources, and our phone # for post-discharge questions.    Maternal Data Does the patient have breastfeeding experience prior to this delivery?: Yes How long did the patient breastfeed?: Per Birth Parent, she BF 1st child and  supplemented infant with formula  for one year  Feeding Mother's Current Feeding Choice: Breast Milk  LATCH Score Latch: Grasps breast easily, tongue down, lips flanged, rhythmical sucking.  Audible Swallowing: A few with stimulation  Type of Nipple: Everted at rest and after stimulation  Comfort (Breast/Nipple): Soft / non-tender  Hold (Positioning): Assistance needed to correctly position infant at breast and maintain latch.  LATCH Score: 8   Lactation Tools Discussed/Used    Interventions Interventions: Breast feeding basics reviewed;Assisted with latch;Skin to skin;Breast compression;Breast massage;Adjust position;Support pillows;Position options;Education;LC Services brochure  Discharge Pump: DEBP;Personal  Consult Status Consult Status: Follow-up Date: 02/05/23 Follow-up type: In-patient    Frederico Hamman 02/04/2023, 11:38 PM

## 2023-02-04 NOTE — MAU Provider Note (Signed)
S: Sherry [name] is a 22 y.o. G2P1001 at [redacted]w[redacted]d  who presents to MAU today complaining of leaking of fluid since waiting in waiting room. She denies vaginal bleeding. She endorses contractions. She reports normal fetal movement.    O: BP 122/71   Pulse 91   Temp 98.8 F (37.1 C) (Oral)   Resp 18   LMP 05/08/2022   SpO2 96%  GENERAL: Well-developed, well-nourished female in no acute distress.  HEAD: Normocephalic, atraumatic.  CHEST: Normal effort of breathing, regular heart rate ABDOMEN: Soft, nontender, gravid PELVIC: Normal external female genitalia. Vagina is pink and rugated. Cervix with normal contour, no lesions. Normal discharge. Thin, white discharge noted on glove after cervical exam.  Cervical exam:  Dilation: 3 Effacement (%): 30 Cervical Position: Posterior Station: -3 Presentation: Vertex Exam by:: Dr. Lucianne Muss   Fetal Monitoring: 125/mod/+a/-d Toco: q4-5  Results for orders placed or performed during the hospital encounter of 02/04/23 (from the past 24 hour(s))  Rupture of Membrane (ROM) Plus     Status: None   Collection Time: 02/04/23  3:54 PM  Result Value Ref Range   Rom Plus NEGATIVE      A/P: SIUP at [redacted]w[redacted]d  Membranes intact  Repeat cervical exam at 4/50/-2 - ctx becoming more intense as well. Given above and multip status, rec admission for labor. Discussed w pt.   Sundra Aland, MD 02/04/2023 5:18 PM

## 2023-02-04 NOTE — H&P (Incomplete Revision)
OBSTETRIC ADMISSION HISTORY AND PHYSICAL  Sherry [name] is a 22 y.o. female G2P1001 with IUP at [redacted]w[redacted]d by LMP presenting for SOL. She reports +FMs, No LOF, no VB, no blurry vision, headaches or peripheral edema, and RUQ pain.  She plans on breast feeding. She request POPs for birth control. She received her prenatal care at  [facility]    Dating: By LMP equal to [redacted]w[redacted]d [place] --->  Estimated Date of Delivery: 02/12/23  Sono:    @[redacted]w[redacted]d , CWD, normal anatomy, normal presentation,  2109g, 51% EFW   Prenatal History/Complications:   Hx of gestational HTN with elevated BP's at [redacted] weeks gestation  Past Medical History: Past Medical History:  Diagnosis Date   Allergy    seasonal   History of chlamydia 05/02/2016   History of gestational hypertension     Past Surgical History: Past Surgical History:  Procedure Laterality Date   WISDOM TOOTH EXTRACTION      Obstetrical History: OB History     Gravida  2   Para  1   Term  1   Preterm  0   AB  0   Living  1      SAB  0   IAB  0   Ectopic  0   Multiple  0   Live Births  1           Social History Social History   Socioeconomic History   Marital status: Single    Spouse name: n/a   Number of children: 2   Years of education: Not on file   Highest education level: Not on file  Occupational History   Occupation: student    Comment: [employer]  Tobacco Use   Smoking status: Never   Smokeless tobacco: Never  Vaping Use   Vaping status: Never Used  Substance and Sexual Activity   Alcohol use: Not Currently   Drug use: No   Sexual activity: Yes    Birth control/protection: None  Other Topics Concern   Not on file  Social History Narrative   Not on file   Social Determinants of Health   Financial Resource Strain: Low Risk  (07/23/2022)   Overall Financial Resource Strain (CARDIA)    Difficulty of Paying Living Expenses: Not very hard  Food Insecurity: No Food Insecurity (07/23/2022)    Hunger Vital Sign    Worried About Running Out of Food in the Last Year: Never true    Ran Out of Food in the Last Year: Never true  Transportation Needs: No Transportation Needs (07/23/2022)   PRAPARE - Administrator, Civil Service (Medical): No    Lack of Transportation (Non-Medical): No  Physical Activity: Insufficiently Active (07/23/2022)   Exercise Vital Sign    Days of Exercise per Week: 5 days    Minutes of Exercise per Session: 20 [name]  Stress: Stress Concern Present (07/23/2022)   [employer] of Occupational Health - Occupational Stress Questionnaire    Feeling of Stress : To some extent  Social Connections: Moderately Isolated (07/23/2022)   Social Connection and Isolation Panel [NHANES]    Frequency of Communication with Friends and Family: More than three times a week    Frequency of Social Gatherings with Friends and Family: Twice a week    Attends Religious Services: 1 to 4 times per year    Active Member of [employer] or Organizations: No    Attends Banker Meetings: Never  Marital Status: Never married    Family History: Family History  Problem Relation Age of Onset   Miscarriages / Stillbirths Mother    Hypertension Mother    Stroke Maternal Grandmother    Kidney disease Maternal Grandmother    Hypertension Maternal Grandmother    Hyperlipidemia Maternal Grandmother    Heart disease Maternal Grandmother     Allergies: No Known Allergies  Medications Prior to Admission  Medication Sig Dispense Refill Last Dose   Prenatal Vit-Fe Fumarate-FA (MULTIVITAMIN-PRENATAL) 27-0.8 MG TABS tablet Take 1 tablet by mouth daily at 12 noon.   02/03/2023     Review of Systems   All systems reviewed and negative except as stated in HPI  Blood pressure 128/83, pulse 80, temperature 98.8 F (37.1 C), temperature source Oral, resp. rate 18, last menstrual period 05/08/2022, SpO2 96%, unknown if currently breastfeeding. General appearance: alert,  cooperative, and appears stated age Lungs: clear to auscultation bilaterally Heart: regular rate and rhythm Abdomen: soft, non-tender; bowel sounds normal Pelvic: 4/50/-2 Extremities: Homans sign is negative, no sign of DVT DTR's intact Presentation: cephalic Fetal monitoringBaseline: 130 bpm, Variability: Good {> 6 bpm), Accelerations: Reactive, and Decelerations: Absent Uterine activityFrequency: Every 5-6 minutes Dilation: 4 Effacement (%): 50 Station: -2 Exam by:: Dr. Lucianne Muss   Prenatal labs: ABO, Rh: O/Positive/-- (02/28 0817) Antibody: Negative (02/28 0817) Rubella: 2.75 (02/28 0817) RPR: Non Reactive (06/24 0827)  HBsAg: Negative (02/28 0817)  HIV: Non Reactive (06/24 0827)  GBS: Negative/-- (08/26 1138)  2 hr Glucola passed Genetic screening  NIPS: LR/Female, AFP: Negative Anatomy US Normal  Prenatal Transfer Tool  Maternal Diabetes: No Genetic Screening: Normal Maternal Ultrasounds/Referrals: Normal Fetal Ultrasounds or other Referrals:  None Maternal Substance Abuse:  No Significant Maternal Medications:  None Significant Maternal Lab Results:  Group B Strep negative Number of Prenatal Visits:greater than 3 verified prenatal visits Other Comments:  None  Results for orders placed or performed during the hospital encounter of 02/04/23 (from the past 24 hour(s))  Rupture of Membrane (ROM) Plus   Collection Time: 02/04/23  3:54 PM  Result Value Ref Range   Rom Plus NEGATIVE     Patient Active Problem List   Diagnosis Date Noted   Elevated blood pressure reading without diagnosis of hypertension 01/28/2023   Echogenic intracardiac focus of fetus on prenatal ultrasound 01/20/2023   History of gestational hypertension 07/26/2022   Supervision of normal pregnancy 07/26/2022    Assessment/Plan:  Sherry [name] is a 22 y.o. G2P1001 at [redacted]w[redacted]d here for SOL.  #Gestation HTN - did have one episode of HTN outpatient at office visit on 09/03. BP upon admission  120's/80's. Continue to monitor.   #Labor:Patient is progressing well in the MAU. Will consider augmentation with pitocin and AROM if required.  #Pain: IV pain medication, considering epidural #FWB: Moderate variability, accelerations present, decelerations absent, Cat I tracing #ID:  GBS negative #MOF: Breast #MOC: POP's #Circ:  Desires circumcision   Moody Bruins, MD  02/04/2023, 5:59 PM  GME ATTESTATION:  Evaluation and management procedures were performed by the [facility] Medicine Resident under my supervision. I was immediately available for direct supervision, assistance and direction throughout this encounter.  I also confirm that I have verified the information documented in the resident's note, and that I have also personally reperformed the pertinent components of the physical exam and all of the medical decision making activities.  I have also made any necessary editorial changes.  Wyn Forster, MD OB Fellow, Faculty

## 2023-02-04 NOTE — Telephone Encounter (Signed)
Pt called stating that she has been having contractions for the past hour that are about 5 min apart. She reports that they are somewhat uncomfortable but mainly feels some pressure. Pt reports positive fetal movement. She denies bleeding or leaking of fluid. Advised pt that if contractions persist, she should go to MAU for evaluation.

## 2023-02-04 NOTE — MAU Note (Signed)
Lin Landsman Ngoc Han Rohlman is a 22 y.o. at [redacted]w[redacted]d here in MAU reporting: Uc's every 5 minutes. Denies any VB and reports while she was in the waiting room she noticed wetness states she is unsure if her water is breaking or not. Endorses +FM Last SVE 1.5/soft   Onset of complaint: today Pain score: 45 Vitals:   02/04/23 1535  BP: 122/71  Pulse: 91  Resp: 18  Temp: 98.8 F (37.1 C)  SpO2: 96%     FHT:125 Lab orders placed from triage:  Crist Fat, Labor eval

## 2023-02-04 NOTE — Discharge Summary (Signed)
Postpartum Discharge Summary     Patient Name: Sherry [name] DOB: [birth year] MRN: [id number]  Date of admission: 02/04/2023 Delivery date:02/04/2023 Delivering provider: Joanne Gavel Date of discharge: 02/06/2023  Admitting diagnosis: Supervision of normal pregnancy in third trimester [Z34.93] [redacted] weeks gestation of pregnancy [Z3A.38] Intrauterine pregnancy: [redacted]w[redacted]d     Secondary diagnosis:  Principal Problem:   NSVD (normal spontaneous vaginal delivery) Active Problems:   History of gestational hypertension   Supervision of normal pregnancy   Echogenic intracardiac focus of fetus on prenatal ultrasound   Elevated blood pressure reading without diagnosis of hypertension   Supervision of normal pregnancy in third trimester   [redacted] weeks gestation of pregnancy   Encounter for HIV pre-exposure prophylaxis  Additional problems: n/a     Discharge diagnosis: Term Pregnancy Delivered                                              Post partum procedures: n/a  Augmentation:  None Complications: None  Hospital course: Onset of Labor With Vaginal Delivery      22 y.o. yo [id number] at [redacted]w[redacted]d was admitted in Latent Labor on 02/04/2023. Labor course was complicated by none.  Membrane Rupture Time/Date: 8:40 PM,02/04/2023  Delivery Method:Vaginal, Spontaneous Operative Delivery:N/A Episiotomy: None Lacerations:  1st degree Patient had a postpartum course complicated by n/a.  She is ambulating, tolerating a regular diet, passing flatus, and urinating well. Patient is discharged home in stable condition on 02/06/23.  Newborn Data: Birth date:02/04/2023 Birth time:8:42 PM Gender:Female Living status:Living Apgars:9 ,9  Weight:3260 g  Magnesium Sulfate received: No BMZ received: No Rhophylac:No MMR:No T-DaP:Given prenatally Flu: N/A Transfusion:No  Physical exam  Vitals:   02/05/23 1946 02/06/23 0528 02/06/23 0942 02/06/23 1054  BP: 117/71 108/64    Pulse: 92 68    Resp:  17 18    Temp: 98.1 F (36.7 C) (!) 97.5 F (36.4 C)    TempSrc:  Oral    SpO2:      Weight:   85.3 kg   Height:    5' 3.5" (1.613 m)   General: alert, cooperative, and no distress Lochia: appropriate Uterine Fundus: firm Incision: N/A DVT Evaluation: No evidence of DVT seen on physical exam. Labs: Lab Results  Component Value Date   WBC 14.8 (H) 02/05/2023   HGB 10.0 (L) 02/05/2023   HCT 31.9 (L) 02/05/2023   MCV 76.0 (L) 02/05/2023   PLT 252 02/05/2023      Latest Ref Rng & Units 05/09/2019    5:15 AM  CMP  Glucose 70 - 99 mg/dL 94   BUN 6 - 20 mg/dL 6   Creatinine 1.30 - 8.65 mg/dL 7.84   Sodium 696 - 295 mmol/L 138   Potassium 3.5 - 5.1 mmol/L 3.8   Chloride 98 - 111 mmol/L 105   CO2 22 - 32 mmol/L 21   Calcium 8.9 - 10.3 mg/dL 9.6   Total Protein 6.5 - 8.1 g/dL 6.2   Total Bilirubin 0.3 - 1.2 mg/dL 0.5   Alkaline Phos 38 - 126 U/L 148   AST 15 - 41 U/L 20   ALT 0 - 44 U/L 19    Edinburgh Score:    02/06/2023    9:00 AM  Edinburgh Postnatal Depression Scale Screening Tool  I have been able to laugh and see  the funny side of things. 0  I have looked forward with enjoyment to things. 0  I have blamed myself unnecessarily when things went wrong. 0  I have been anxious or worried for no good reason. 0  I have felt scared or panicky for no good reason. 0  Things have been getting on top of me. 1  I have been so unhappy that I have had difficulty sleeping. 1  I have felt sad or miserable. 0  I have been so unhappy that I have been crying. 1  The thought of harming myself has occurred to me. 0  Edinburgh Postnatal Depression Scale Total 3     After visit meds:  Allergies as of 02/06/2023   No Known Allergies      Medication List     TAKE these medications    multivitamin-prenatal 27-0.8 MG Tabs tablet Take 1 tablet by mouth daily at 12 noon.   norethindrone 0.35 MG tablet Commonly known as: MICRONOR Take 1 tablet (0.35 mg total) by mouth daily.    senna-docusate 8.6-50 MG tablet Commonly known as: Senokot-S Take 2 tablets by mouth daily.         Discharge home in stable condition Infant Feeding: Breast Infant Disposition:home with mother Discharge instruction: per After Visit Summary and Postpartum booklet. Activity: Advance as tolerated. Pelvic rest for 6 weeks.  Diet: routine diet Future Appointments: Future Appointments  Date Time Provider Department Center  03/18/2023 11:15 AM Jerene Bears, MD [facility]   Follow up Visit:  Message sent to [facility] 9/10  Please schedule this patient for a In person postpartum visit in 6 weeks with the following provider: Any provider. Additional Postpartum F/U: None   Low risk pregnancy complicated by:  none Delivery mode:  Vaginal, Spontaneous Anticipated Birth Control:  POPs   02/06/2023 Hessie Dibble, MD

## 2023-02-05 DIAGNOSIS — Z3A38 38 weeks gestation of pregnancy: Secondary | ICD-10-CM

## 2023-02-05 LAB — CBC
HCT: 31.9 % — ABNORMAL LOW (ref 36.0–46.0)
Hemoglobin: 10 g/dL — ABNORMAL LOW (ref 12.0–15.0)
MCH: 23.8 pg — ABNORMAL LOW (ref 26.0–34.0)
MCHC: 31.3 g/dL (ref 30.0–36.0)
MCV: 76 fL — ABNORMAL LOW (ref 80.0–100.0)
Platelets: 252 K/uL (ref 150–400)
RBC: 4.2 MIL/uL (ref 3.87–5.11)
RDW: 15.1 % (ref 11.5–15.5)
WBC: 14.8 K/uL — ABNORMAL HIGH (ref 4.0–10.5)
nRBC: 0 % (ref 0.0–0.2)

## 2023-02-05 LAB — HIV ANTIBODY (ROUTINE TESTING W REFLEX): HIV Screen 4th Generation wRfx: NONREACTIVE

## 2023-02-05 LAB — RPR: RPR Ser Ql: NONREACTIVE

## 2023-02-05 LAB — SYPHILIS: RPR W/REFLEX TO RPR TITER AND TREPONEMAL ANTIBODIES, TRADITIONAL SCREENING AND DIAGNOSIS ALGORITHM: RPR Ser Ql: NONREACTIVE

## 2023-02-05 MED ORDER — OXYCODONE-ACETAMINOPHEN 5-325 MG PO TABS: 2.0000 | ORAL_TABLET | ORAL | Status: DC | PRN

## 2023-02-05 MED ORDER — ACETAMINOPHEN 325 MG PO TABS: 650.0000 mg | ORAL_TABLET | ORAL | Status: DC | PRN

## 2023-02-05 MED ORDER — LACTATED RINGERS IV SOLN: 500.0000 mL | INTRAVENOUS | Status: DC | PRN

## 2023-02-05 MED ORDER — LIDOCAINE HCL (PF) 1 % IJ SOLN: 30.0000 mL | INTRAMUSCULAR | Status: DC | PRN

## 2023-02-05 MED ORDER — OXYTOCIN BOLUS FROM INFUSION: 333.0000 mL | Freq: Once | INTRAVENOUS | Status: DC

## 2023-02-05 MED ORDER — OXYTOCIN-SODIUM CHLORIDE 30-0.9 UT/500ML-% IV SOLN: 2.5000 [IU]/h | INTRAVENOUS | Status: DC

## 2023-02-05 MED ORDER — TERBUTALINE SULFATE 1 MG/ML IJ SOLN: 0.2500 mg | Freq: Once | INTRAMUSCULAR | Status: DC | PRN

## 2023-02-05 MED ORDER — OXYCODONE-ACETAMINOPHEN 5-325 MG PO TABS: 1.0000 | ORAL_TABLET | ORAL | Status: DC | PRN

## 2023-02-05 MED ORDER — ONDANSETRON HCL 4 MG/2ML IJ SOLN: 4.0000 mg | Freq: Four times a day (QID) | INTRAMUSCULAR | Status: DC | PRN

## 2023-02-05 MED ORDER — LACTATED RINGERS IV SOLN: INTRAVENOUS | Status: DC

## 2023-02-05 MED ORDER — FENTANYL CITRATE (PF) 100 MCG/2ML IJ SOLN: 50.0000 ug | INTRAMUSCULAR | Status: DC | PRN

## 2023-02-05 MED ORDER — OXYTOCIN-SODIUM CHLORIDE 30-0.9 UT/500ML-% IV SOLN: 1.0000 m[IU]/min | INTRAVENOUS | Status: DC

## 2023-02-05 MED ORDER — SOD CITRATE-CITRIC ACID 500-334 MG/5ML PO SOLN: 30.0000 mL | ORAL | Status: DC | PRN

## 2023-02-05 NOTE — Plan of Care (Signed)
Progressing

## 2023-02-05 NOTE — Progress Notes (Addendum)
POSTPARTUM PROGRESS NOTE  Post Partum Day 1  Subjective:  Sherry [name] is a 22 y.o. G2P2002 s/p TSVD at [redacted]w[redacted]d.  She reports she is doing well. No acute events overnight. She denies any problems with ambulating, voiding or po intake. Denies nausea or vomiting.  Pain is well controlled.  Lochia is appropriate.  Objective: Blood pressure (!) 115/59, pulse 83, temperature 98.2 F (36.8 C), temperature source Oral, resp. rate 16, last menstrual period 05/08/2022, SpO2 97%, unknown if currently breastfeeding.  Physical Exam:  General: alert, cooperative and no distress Chest: no respiratory distress Heart:regular rate, distal pulses intact Abdomen: soft, nontender,  Uterine Fundus: firm, appropriately tender DVT Evaluation: No calf swelling or tenderness Extremities: No edema Skin: warm, dry  Recent Labs    02/04/23 2011 02/05/23 0806  HGB 11.5* 10.0*  HCT 35.6* 31.9*    Assessment/Plan: Sherry [name] is a 22 y.o. [id number] s/p TSVD at [redacted]w[redacted]d   PPD#1 - Doing well  Routine postpartum care  Contraception: POPs Feeding: Breast Dispo: Plan for discharge 02/06/2023.   LOS: 1 day   Osie Cheeks, MD Resident 02/05/2023, 9:21 AM   GME ATTESTATION:  Evaluation and management procedures were performed by the [facility] Medicine Resident under my supervision. I was immediately available for direct supervision, assistance and direction throughout this encounter.  I also confirm that I have verified the information documented in the resident's note, and that I have also personally reperformed the pertinent components of the physical exam and all of the medical decision making activities.  I have also made any necessary editorial changes.  Wyn Forster, MD OB Fellow, Faculty [facility], Center for [facility] 02/05/2023 10:35 AM

## 2023-02-05 NOTE — Lactation Note (Signed)
This note was copied from a baby's chart. Lactation Consultation Note  Patient Name: Sherry [name] Sherry [name] Date: 02/05/2023 Age:24 hours Reason for consult: Follow-up assessment  P2, Baby has latched since birth but mother states she latches but only sucks a few times and falls asleep.  Encouraged mother to attempt q 2 hours. In the meantime, recommend using hand pump for stimulation for 10 min and continue to hand express and place baby skin to skin.  Lactation to follow up later today.  Maternal Data Does the patient have breastfeeding experience prior to this delivery?: Yes  Feeding Mother's Current Feeding Choice: Breast Milk  Lactation Tools Discussed/Used Tools: Pump Breast pump type: Manual  Interventions Interventions: Breast feeding basics reviewed;Assisted with latch;Skin to skin;Hand express;Hand pump;Education  Consult Status Consult Status: Follow-up Date: 02/06/23 Follow-up type: In-patient    Dahlia Byes [facility] 02/05/2023, 1:28 PM

## 2023-02-06 ENCOUNTER — Encounter (HOSPITAL_BASED_OUTPATIENT_CLINIC_OR_DEPARTMENT_OTHER): Payer: BC Managed Care – PPO | Admitting: Obstetrics & Gynecology

## 2023-02-06 ENCOUNTER — Encounter (HOSPITAL_COMMUNITY): Payer: Self-pay | Admitting: Obstetrics & Gynecology

## 2023-02-06 DIAGNOSIS — Z2981 Encounter for HIV pre-exposure prophylaxis: Secondary | ICD-10-CM

## 2023-02-06 HISTORY — PX: CIRCUMCISION BABY: PRO46

## 2023-02-06 MED ORDER — NORETHINDRONE 0.35 MG PO TABS: 1.0000 | ORAL_TABLET | Freq: Every day | ORAL | 3 refills | Status: DC

## 2023-02-06 MED ORDER — INFLUENZA VIRUS VACC SPLIT PF (FLUZONE) 0.5 ML IM SUSY
0.5000 mL | PREFILLED_SYRINGE | Freq: Once | INTRAMUSCULAR | Status: AC
  Administered 2023-02-06: 0.5 mL via INTRAMUSCULAR

## 2023-02-06 MED ORDER — SENNOSIDES-DOCUSATE SODIUM 8.6-50 MG PO TABS: 2.0000 | ORAL_TABLET | Freq: Every day | ORAL | 1 refills | Status: AC

## 2023-02-06 MED ORDER — INFLUENZA VIRUS VACC SPLIT PF (FLUZONE) 0.5 ML IM SUSY
0.5000 mL | PREFILLED_SYRINGE | INTRAMUSCULAR | Status: DC
Start: 2023-02-07 — End: 2023-02-06

## 2023-02-06 NOTE — Discharge Instructions (Signed)
WHAT TO LOOK OUT FOR: Fever of 100.4 or above Mastitis: feels like flu and breasts hurt Infection: increased pain, swelling or redness Blood clots golf ball size or larger Postpartum depression   Congratulations on your newest addition!

## 2023-02-06 NOTE — Procedures (Deleted)
The note originally documented on this encounter has been moved the the encounter in which it belongs.

## 2023-02-06 NOTE — Plan of Care (Signed)
Problem: Education: Goal: Knowledge of Childbirth will improve 02/06/2023 1505 by Donne Hazel, LPN Outcome: Adequate for Discharge 02/06/2023 0949 by Donne Hazel, LPN Outcome: Progressing 02/06/2023 0943 by Donne Hazel, LPN Outcome: Progressing Goal: Ability to make informed decisions regarding treatment and plan of care will improve 02/06/2023 1505 by Donne Hazel, LPN Outcome: Adequate for Discharge 02/06/2023 0949 by Donne Hazel, LPN Outcome: Progressing 02/06/2023 0943 by Donne Hazel, LPN Outcome: Progressing Goal: Ability to state and carry out methods to decrease the pain will improve 02/06/2023 1505 by Donne Hazel, LPN Outcome: Adequate for Discharge 02/06/2023 0949 by Donne Hazel, LPN Outcome: Progressing 02/06/2023 0943 by Donne Hazel, LPN Outcome: Progressing Goal: Individualized Educational Video(s) 02/06/2023 1505 by Donne Hazel, LPN Outcome: Adequate for Discharge 02/06/2023 0949 by Donne Hazel, LPN Outcome: Progressing 02/06/2023 0943 by Donne Hazel, LPN Outcome: Progressing   Problem: Coping: Goal: Ability to verbalize concerns and feelings about labor and delivery will improve 02/06/2023 1505 by Donne Hazel, LPN Outcome: Adequate for Discharge 02/06/2023 0949 by Donne Hazel, LPN Outcome: Progressing 02/06/2023 0943 by Donne Hazel, LPN Outcome: Progressing   Problem: Life Cycle: Goal: Ability to make normal progression through stages of labor will improve 02/06/2023 1505 by Donne Hazel, LPN Outcome: Adequate for Discharge 02/06/2023 0949 by Donne Hazel, LPN Outcome: Progressing 02/06/2023 0943 by Donne Hazel, LPN Outcome: Progressing Goal: Ability to effectively push during vaginal delivery will improve 02/06/2023 1505 by Donne Hazel, LPN Outcome: Adequate for Discharge 02/06/2023 0949 by Donne Hazel, LPN Outcome: Progressing 02/06/2023 0943 by Donne Hazel, LPN Outcome: Progressing   Problem: Role  Relationship: Goal: Will demonstrate positive interactions with the child 02/06/2023 1505 by Donne Hazel, LPN Outcome: Adequate for Discharge 02/06/2023 0949 by Donne Hazel, LPN Outcome: Progressing 02/06/2023 0943 by Donne Hazel, LPN Outcome: Progressing   Problem: Safety: Goal: Risk of complications during labor and delivery will decrease 02/06/2023 1505 by Donne Hazel, LPN Outcome: Adequate for Discharge 02/06/2023 0949 by Donne Hazel, LPN Outcome: Progressing 02/06/2023 0943 by Donne Hazel, LPN Outcome: Progressing   Problem: Pain Management: Goal: Relief or control of pain from uterine contractions will improve 02/06/2023 1505 by Donne Hazel, LPN Outcome: Adequate for Discharge 02/06/2023 0949 by Donne Hazel, LPN Outcome: Progressing 02/06/2023 0943 by Donne Hazel, LPN Outcome: Progressing   Problem: Education: Goal: Knowledge of General Education information will improve Description: Including pain rating scale, medication(s)/side effects and non-pharmacologic comfort measures 02/06/2023 1505 by Donne Hazel, LPN Outcome: Adequate for Discharge 02/06/2023 0949 by Donne Hazel, LPN Outcome: Progressing 02/06/2023 0943 by Donne Hazel, LPN Outcome: Progressing   Problem: Health Behavior/Discharge Planning: Goal: Ability to manage health-related needs will improve 02/06/2023 1505 by Donne Hazel, LPN Outcome: Adequate for Discharge 02/06/2023 0949 by Donne Hazel, LPN Outcome: Progressing 02/06/2023 0943 by Donne Hazel, LPN Outcome: Progressing   Problem: Clinical Measurements: Goal: Ability to maintain clinical measurements within normal limits will improve 02/06/2023 1505 by Donne Hazel, LPN Outcome: Adequate for Discharge 02/06/2023 0949 by Donne Hazel, LPN Outcome: Progressing 02/06/2023 0943 by Donne Hazel, LPN Outcome: Progressing Goal: Will remain free from infection 02/06/2023 1505 by Donne Hazel, LPN Outcome:  Adequate for Discharge 02/06/2023 0949 by Donne Hazel, LPN Outcome: Progressing 02/06/2023 0943 by Donne Hazel, LPN Outcome: Progressing Goal: Diagnostic test results will improve  02/06/2023 1505 by Donne Hazel, LPN Outcome: Adequate for Discharge 02/06/2023 0949 by Donne Hazel, LPN Outcome: Progressing 02/06/2023 0943 by Donne Hazel, LPN Outcome: Progressing Goal: Respiratory complications will improve 02/06/2023 1505 by Donne Hazel, LPN Outcome: Adequate for Discharge 02/06/2023 0949 by Donne Hazel, LPN Outcome: Progressing 02/06/2023 0943 by Donne Hazel, LPN Outcome: Progressing Goal: Cardiovascular complication will be avoided 02/06/2023 1505 by Donne Hazel, LPN Outcome: Adequate for Discharge 02/06/2023 0949 by Donne Hazel, LPN Outcome: Progressing 02/06/2023 0943 by Donne Hazel, LPN Outcome: Progressing   Problem: Activity: Goal: Risk for activity intolerance will decrease 02/06/2023 1505 by Donne Hazel, LPN Outcome: Adequate for Discharge 02/06/2023 0949 by Donne Hazel, LPN Outcome: Progressing 02/06/2023 0943 by Donne Hazel, LPN Outcome: Progressing   Problem: Nutrition: Goal: Adequate nutrition will be maintained 02/06/2023 1505 by Donne Hazel, LPN Outcome: Adequate for Discharge 02/06/2023 0949 by Donne Hazel, LPN Outcome: Progressing 02/06/2023 0943 by Donne Hazel, LPN Outcome: Progressing   Problem: Coping: Goal: Level of anxiety will decrease 02/06/2023 1505 by Donne Hazel, LPN Outcome: Adequate for Discharge 02/06/2023 0949 by Donne Hazel, LPN Outcome: Progressing 02/06/2023 0943 by Donne Hazel, LPN Outcome: Progressing   Problem: Elimination: Goal: Will not experience complications related to bowel motility 02/06/2023 1505 by Donne Hazel, LPN Outcome: Adequate for Discharge 02/06/2023 0949 by Donne Hazel, LPN Outcome: Progressing 02/06/2023 0943 by Donne Hazel, LPN Outcome: Progressing Goal:  Will not experience complications related to urinary retention 02/06/2023 1505 by Donne Hazel, LPN Outcome: Adequate for Discharge 02/06/2023 0949 by Donne Hazel, LPN Outcome: Progressing 02/06/2023 0943 by Donne Hazel, LPN Outcome: Progressing   Problem: Pain Managment: Goal: General experience of comfort will improve 02/06/2023 1505 by Donne Hazel, LPN Outcome: Adequate for Discharge 02/06/2023 0949 by Donne Hazel, LPN Outcome: Progressing 02/06/2023 0943 by Donne Hazel, LPN Outcome: Progressing   Problem: Safety: Goal: Ability to remain free from injury will improve 02/06/2023 1505 by Donne Hazel, LPN Outcome: Adequate for Discharge 02/06/2023 0949 by Donne Hazel, LPN Outcome: Progressing 02/06/2023 0943 by Donne Hazel, LPN Outcome: Progressing   Problem: Skin Integrity: Goal: Risk for impaired skin integrity will decrease 02/06/2023 1505 by Donne Hazel, LPN Outcome: Adequate for Discharge 02/06/2023 0949 by Donne Hazel, LPN Outcome: Progressing 02/06/2023 0943 by Donne Hazel, LPN Outcome: Progressing   Problem: Education: Goal: Knowledge of condition will improve 02/06/2023 1505 by Donne Hazel, LPN Outcome: Adequate for Discharge 02/06/2023 0949 by Donne Hazel, LPN Outcome: Progressing 02/06/2023 0943 by Donne Hazel, LPN Outcome: Progressing Goal: Individualized Educational Video(s) 02/06/2023 1505 by Donne Hazel, LPN Outcome: Adequate for Discharge 02/06/2023 0949 by Donne Hazel, LPN Outcome: Progressing 02/06/2023 0943 by Donne Hazel, LPN Outcome: Progressing Goal: Individualized Newborn Educational Video(s) 02/06/2023 1505 by Donne Hazel, LPN Outcome: Adequate for Discharge 02/06/2023 0949 by Donne Hazel, LPN Outcome: Progressing 02/06/2023 0943 by Donne Hazel, LPN Outcome: Progressing   Problem: Activity: Goal: Will verbalize the importance of balancing activity with adequate rest periods 02/06/2023 1505 by  Donne Hazel, LPN Outcome: Adequate for Discharge 02/06/2023 0949 by Donne Hazel, LPN Outcome: Progressing 02/06/2023 0943 by Donne Hazel, LPN Outcome: Progressing Goal: Ability to tolerate increased activity will improve 02/06/2023 1505 by Donne Hazel, LPN Outcome: Adequate for Discharge 02/06/2023 0949 by Donne Hazel, LPN  Outcome: Progressing 02/06/2023 0943 by Donne Hazel, LPN Outcome: Progressing   Problem: Coping: Goal: Ability to identify and utilize available resources and services will improve 02/06/2023 1505 by Donne Hazel, LPN Outcome: Adequate for Discharge 02/06/2023 0949 by Donne Hazel, LPN Outcome: Progressing 02/06/2023 0943 by Donne Hazel, LPN Outcome: Progressing   Problem: Life Cycle: Goal: Chance of risk for complications during the postpartum period will decrease 02/06/2023 1505 by Donne Hazel, LPN Outcome: Adequate for Discharge 02/06/2023 0949 by Donne Hazel, LPN Outcome: Progressing 02/06/2023 0943 by Donne Hazel, LPN Outcome: Progressing   Problem: Role Relationship: Goal: Ability to demonstrate positive interaction with newborn will improve 02/06/2023 1505 by Donne Hazel, LPN Outcome: Adequate for Discharge 02/06/2023 0949 by Donne Hazel, LPN Outcome: Progressing 02/06/2023 0943 by Donne Hazel, LPN Outcome: Progressing   Problem: Skin Integrity: Goal: Demonstration of wound healing without infection will improve 02/06/2023 1505 by Donne Hazel, LPN Outcome: Adequate for Discharge 02/06/2023 0949 by Donne Hazel, LPN Outcome: Progressing 02/06/2023 0943 by Donne Hazel, LPN Outcome: Progressing

## 2023-02-06 NOTE — Plan of Care (Signed)
Problem: Education: Goal: Knowledge of Childbirth will improve 02/06/2023 0949 by Sherry Hazel, LPN Outcome: Progressing 02/06/2023 0943 by Sherry Hazel, LPN Outcome: Progressing Goal: Ability to make informed decisions regarding treatment and plan of care will improve 02/06/2023 0949 by Sherry Hazel, LPN Outcome: Progressing 02/06/2023 0943 by Sherry Hazel, LPN Outcome: Progressing Goal: Ability to state and carry out methods to decrease the pain will improve 02/06/2023 0949 by Sherry Hazel, LPN Outcome: Progressing 02/06/2023 0943 by Sherry Hazel, LPN Outcome: Progressing Goal: Individualized Educational Video(s) 02/06/2023 0949 by Sherry Hazel, LPN Outcome: Progressing 02/06/2023 0943 by Sherry Hazel, LPN Outcome: Progressing   Problem: Coping: Goal: Ability to verbalize concerns and feelings about labor and delivery will improve 02/06/2023 0949 by Sherry Hazel, LPN Outcome: Progressing 02/06/2023 0943 by Sherry Hazel, LPN Outcome: Progressing   Problem: Life Cycle: Goal: Ability to make normal progression through stages of labor will improve 02/06/2023 0949 by Sherry Hazel, LPN Outcome: Progressing 02/06/2023 0943 by Sherry Hazel, LPN Outcome: Progressing Goal: Ability to effectively push during vaginal delivery will improve 02/06/2023 0949 by Sherry Hazel, LPN Outcome: Progressing 02/06/2023 0943 by Sherry Hazel, LPN Outcome: Progressing   Problem: Role Relationship: Goal: Will demonstrate positive interactions with the child 02/06/2023 0949 by Sherry Hazel, LPN Outcome: Progressing 02/06/2023 0943 by Sherry Hazel, LPN Outcome: Progressing   Problem: Safety: Goal: Risk of complications during labor and delivery will decrease 02/06/2023 0949 by Sherry Hazel, LPN Outcome: Progressing 02/06/2023 0943 by Sherry Hazel, LPN Outcome: Progressing   Problem: Pain Management: Goal: Relief or control of pain from uterine contractions will  improve 02/06/2023 0949 by Sherry Hazel, LPN Outcome: Progressing 02/06/2023 0943 by Sherry Hazel, LPN Outcome: Progressing   Problem: Education: Goal: Knowledge of General Education information will improve Description: Including pain rating scale, medication(s)/side effects and non-pharmacologic comfort measures 02/06/2023 0949 by Sherry Hazel, LPN Outcome: Progressing 02/06/2023 0943 by Sherry Hazel, LPN Outcome: Progressing   Problem: Health Behavior/Discharge Planning: Goal: Ability to manage health-related needs will improve 02/06/2023 0949 by Sherry Hazel, LPN Outcome: Progressing 02/06/2023 0943 by Sherry Hazel, LPN Outcome: Progressing   Problem: Clinical Measurements: Goal: Ability to maintain clinical measurements within normal limits will improve 02/06/2023 0949 by Sherry Hazel, LPN Outcome: Progressing 02/06/2023 0943 by Sherry Hazel, LPN Outcome: Progressing Goal: Will remain free from infection 02/06/2023 0949 by Sherry Hazel, LPN Outcome: Progressing 02/06/2023 0943 by Sherry Hazel, LPN Outcome: Progressing Goal: Diagnostic test results will improve 02/06/2023 0949 by Sherry Hazel, LPN Outcome: Progressing 02/06/2023 0943 by Sherry Hazel, LPN Outcome: Progressing Goal: Respiratory complications will improve 02/06/2023 0949 by Sherry Hazel, LPN Outcome: Progressing 02/06/2023 0943 by Sherry Hazel, LPN Outcome: Progressing Goal: Cardiovascular complication will be avoided 02/06/2023 0949 by Sherry Hazel, LPN Outcome: Progressing 02/06/2023 0943 by Sherry Hazel, LPN Outcome: Progressing   Problem: Activity: Goal: Risk for activity intolerance will decrease 02/06/2023 0949 by Sherry Hazel, LPN Outcome: Progressing 02/06/2023 0943 by Sherry Hazel, LPN Outcome: Progressing   Problem: Nutrition: Goal: Adequate nutrition will be maintained 02/06/2023 0949 by Sherry Hazel, LPN Outcome: Progressing 02/06/2023 0943 by Sherry Hazel,  LPN Outcome: Progressing   Problem: Coping: Goal: Level of anxiety will decrease 02/06/2023 0949 by Sherry Hazel, LPN Outcome: Progressing 02/06/2023 0943 by Sherry Hazel, LPN Outcome: Progressing   Problem: Elimination:  Goal: Will not experience complications related to bowel motility 02/06/2023 0949 by Sherry Hazel, LPN Outcome: Progressing 02/06/2023 0943 by Sherry Hazel, LPN Outcome: Progressing Goal: Will not experience complications related to urinary retention 02/06/2023 0949 by Sherry Hazel, LPN Outcome: Progressing 02/06/2023 0943 by Sherry Hazel, LPN Outcome: Progressing   Problem: Pain Managment: Goal: General experience of comfort will improve 02/06/2023 0949 by Sherry Hazel, LPN Outcome: Progressing 02/06/2023 0943 by Sherry Hazel, LPN Outcome: Progressing   Problem: Safety: Goal: Ability to remain free from injury will improve 02/06/2023 0949 by Sherry Hazel, LPN Outcome: Progressing 02/06/2023 0943 by Sherry Hazel, LPN Outcome: Progressing   Problem: Skin Integrity: Goal: Risk for impaired skin integrity will decrease 02/06/2023 0949 by Sherry Hazel, LPN Outcome: Progressing 02/06/2023 0943 by Sherry Hazel, LPN Outcome: Progressing   Problem: Education: Goal: Knowledge of condition will improve 02/06/2023 0949 by Sherry Hazel, LPN Outcome: Progressing 02/06/2023 0943 by Sherry Hazel, LPN Outcome: Progressing Goal: Individualized Educational Video(s) 02/06/2023 0949 by Sherry Hazel, LPN Outcome: Progressing 02/06/2023 0943 by Sherry Hazel, LPN Outcome: Progressing Goal: Individualized Newborn Educational Video(s) 02/06/2023 0949 by Sherry Hazel, LPN Outcome: Progressing 02/06/2023 0943 by Sherry Hazel, LPN Outcome: Progressing   Problem: Activity: Goal: Will verbalize the importance of balancing activity with adequate rest periods 02/06/2023 0949 by Sherry Hazel, LPN Outcome: Progressing 02/06/2023 0943 by Sherry Hazel,  LPN Outcome: Progressing Goal: Ability to tolerate increased activity will improve 02/06/2023 0949 by Sherry Hazel, LPN Outcome: Progressing 02/06/2023 0943 by Sherry Hazel, LPN Outcome: Progressing   Problem: Coping: Goal: Ability to identify and utilize available resources and services will improve 02/06/2023 0949 by Sherry Hazel, LPN Outcome: Progressing 02/06/2023 0943 by Sherry Hazel, LPN Outcome: Progressing   Problem: Life Cycle: Goal: Chance of risk for complications during the postpartum period will decrease 02/06/2023 0949 by Sherry Hazel, LPN Outcome: Progressing 02/06/2023 0943 by Sherry Hazel, LPN Outcome: Progressing   Problem: Role Relationship: Goal: Ability to demonstrate positive interaction with newborn will improve 02/06/2023 0949 by Sherry Hazel, LPN Outcome: Progressing 02/06/2023 0943 by Sherry Hazel, LPN Outcome: Progressing   Problem: Skin Integrity: Goal: Demonstration of wound healing without infection will improve 02/06/2023 0949 by Sherry Hazel, LPN Outcome: Progressing 02/06/2023 0943 by Sherry Hazel, LPN Outcome: Progressing

## 2023-02-06 NOTE — Lactation Note (Signed)
This note was copied from a baby's chart. Lactation Consultation Note Mom stated baby will get on the breast, suckle a few times then go to sleep. Mom will wake baby up he will suckle a few times go to sleep.  Needing lots of stimulation to keep baby suckling. Baby favors the Rt. Breast. Mom stated her 24 yr old did the same thing. Lt. Nipple is everted well but not as long as the Rt. But still able to latch well. Assisted in football hold to Lt. Nipple baby constantly off and on. Noted baby's top lip and his nose pulling up and down when suckling. Needing upper lip flange d/t turning into mouth. Baby has labial frenulum to end of gum line. Noted baby tongue thrusting nipple after suckling some nipple pops out appearing to look as if tongue thrusting. Mom stated she would like to pump to see if she can get any colostrum to give to baby. While pumping dad was holding baby, baby cueing and getting fussy. Dad brought baby to mom to BF. Baby constantly off and on like he can't maintain a latch. Frustrating to baby, and parents. Different positioning attempted and same thing continues to happen. Discussed cluster feeding is probably starting.  Praised mom for all her hard work.  Patient Name: Sherry [name] [id number] Date: 02/06/2023 Age:68 hours Reason for consult: Follow-up assessment;MD order;Early term 37-38.6wks   Maternal Data Has patient been taught Hand Expression?: Yes Does the patient have breastfeeding experience prior to this delivery?: Yes How long did the patient breastfeed?: 1 yr  Feeding    LATCH Score Latch: Repeated attempts needed to sustain latch, nipple held in mouth throughout feeding, stimulation needed to elicit sucking reflex.  Audible Swallowing: A few with stimulation  Type of Nipple: Everted at rest and after stimulation  Comfort (Breast/Nipple): Soft / non-tender  Hold (Positioning): Assistance needed to correctly position infant at breast and maintain  latch.  LATCH Score: 7   Lactation Tools Discussed/Used Tools: Pump;Flanges Flange Size: 24 Breast pump type: Double-Electric Breast Pump Pump Education: Setup, frequency, and cleaning;Milk Storage Reason for Pumping: supplement Pumping frequency: q 3hr Pumped volume: 0.5 mL  Interventions Interventions: Breast feeding basics reviewed;Assisted with latch;Skin to skin;Breast massage;Hand express;Breast compression;Adjust position;Support pillows;Position options;Expressed milk;DEBP;Education  Discharge    Consult Status Consult Status: Follow-up Date: 02/06/23 Follow-up type: In-patient    Charyl Dancer 02/06/2023, 12:04 AM

## 2023-02-06 NOTE — Plan of Care (Signed)

## 2023-02-10 ENCOUNTER — Encounter (HOSPITAL_BASED_OUTPATIENT_CLINIC_OR_DEPARTMENT_OTHER): Payer: BC Managed Care – PPO | Admitting: Obstetrics & Gynecology

## 2023-02-11 ENCOUNTER — Encounter (HOSPITAL_BASED_OUTPATIENT_CLINIC_OR_DEPARTMENT_OTHER): Payer: BC Managed Care – PPO | Admitting: Advanced Practice Midwife

## 2023-02-17 ENCOUNTER — Encounter (HOSPITAL_BASED_OUTPATIENT_CLINIC_OR_DEPARTMENT_OTHER): Payer: BC Managed Care – PPO | Admitting: Obstetrics & Gynecology

## 2023-03-06 ENCOUNTER — Telehealth (HOSPITAL_COMMUNITY): Payer: Self-pay | Admitting: *Deleted

## 2023-03-06 NOTE — Telephone Encounter (Signed)
03/06/2023  Name: Sherry [name] MRN: [id number] DOB: 06-24-2000  Reason for Call:  Transition of Care Hospital Discharge Call  Contact Status: Patient Contact Status: Complete  Language assistant needed: Interpreter Mode: Interpreter Not Needed        Follow-Up Questions: Do You Have Any Concerns About Your Health As You Heal From Delivery?: Yes What Concerns Do You Have About Your Health?: Bleeding had stopped and now she has begun to have dark red bleeding again.  Wonders if this is lochia or 1st period after delivery.  Postpartum check scheduled for Oct 22.  Advised that the bleeding could be a first period and to discuss at Oct 22 visit.  Reviewed parameters for normal amounts of bleeding and when to call provider. Do You Have Any Concerns About Your Infants Health?: No  Edinburgh Postnatal Depression Scale:  In the Past 7 Days: I have been able to laugh and see the funny side of things.: As much as I always could I have looked forward with enjoyment to things.: As much as I ever did I have blamed myself unnecessarily when things went wrong.: Not very often I have been anxious or worried for no good reason.: No, not at all I have felt scared or panicky for no good reason.: No, not at all Things have been getting on top of me.: No, I have been coping as well as ever I have been so unhappy that I have had difficulty sleeping.: Not at all I have felt sad or miserable.: No, not at all I have been so unhappy that I have been crying.: Only occasionally The thought of harming myself has occurred to me.: Never Edinburgh Postnatal Depression Scale Total: 2  PHQ2-9 Depression Scale:     Discharge Follow-up: Edinburgh score requires follow up?: No Patient was advised of the following resources:: Breastfeeding Support Group, Support Group  Post-discharge interventions: Reviewed Newborn Safe Sleep Practices  Salena Saner, RN 03/06/2023 11:10

## 2023-03-18 ENCOUNTER — Encounter (HOSPITAL_BASED_OUTPATIENT_CLINIC_OR_DEPARTMENT_OTHER): Payer: Self-pay | Admitting: Certified Nurse Midwife

## 2023-03-18 ENCOUNTER — Ambulatory Visit (HOSPITAL_BASED_OUTPATIENT_CLINIC_OR_DEPARTMENT_OTHER): Payer: BC Managed Care – PPO | Admitting: Certified Nurse Midwife

## 2023-03-18 NOTE — Progress Notes (Signed)
Subjective:     Sherry [name] is a 22 y.o. female who presents for a postpartum visit. She is 6 weeks postpartum following a spontaneous vaginal delivery. I have fully reviewed the prenatal and intrapartum course. The delivery was at 38 gestational weeks. Outcome: spontaneous vaginal delivery. Anesthesia: epidural. Postpartum course has been uneventful. Baby's course has been uneventful. Baby is feeding by breast. Bleeding no bleeding and pt does think she had her first period since delivery around 10/10 . Bowel function is normal. Bladder function is normal. Patient is sexually active. Contraception method is oral progesterone-only contraceptive. Postpartum depression screening: negative.  The following portions of the patient's history were reviewed and updated as appropriate: allergies, current medications, past family history, past medical history, past social history, past surgical history, and problem list.  Review of Systems Pertinent items are noted in HPI.   Objective:    BP 134/88 (BP Location: Left Arm, Patient Position: Sitting, Cuff Size: Normal)   Pulse 81   Ht 5' 3.5" (1.613 m)   Wt 175 lb 6.4 oz (79.6 kg)   LMP 03/11/2023 (Approximate)   Breastfeeding Yes   BMI 30.58 kg/m   General:  alert, cooperative, and appears stated age   Breasts:  inspection negative, no nipple discharge or bleeding, no masses or nodularity palpable  Lungs: clear to auscultation bilaterally  Heart:  regular rate and rhythm, S1, S2 normal, no murmur, click, rub or gallop  Abdomen: soft, non-tender; bowel sounds normal; no masses,  no organomegaly   Vulva:  normal  Vagina: normal vagina, no discharge, exudate, lesion, or erythema  Cervix:  no cervical motion tenderness and no lesions  Corpus: normal size, contour, position, consistency, mobility, non-tender  Adnexa:  no mass, fullness, tenderness  Rectal Exam: Not performed.        Assessment:    G2P2 Term SVD of a viable female here for  routine  postpartum exam. Pap smear not done at today's visit.   Plan:    1. Contraception: OCP (estrogen/progesterone) 2. Postpartum Depression Screen Negative  Sherry [name]  3. Follow up in: 1  year for annual gyn exam   or as needed.

## 2023-05-12 ENCOUNTER — Ambulatory Visit (HOSPITAL_BASED_OUTPATIENT_CLINIC_OR_DEPARTMENT_OTHER): Payer: BC Managed Care – PPO | Admitting: Certified Nurse Midwife

## 2023-05-12 VITALS — BP 116/78 | HR 108 | Ht 63.5 in | Wt 172.4 lb

## 2023-05-12 DIAGNOSIS — O9229 Other disorders of breast associated with pregnancy and the puerperium: Secondary | ICD-10-CM

## 2023-05-12 DIAGNOSIS — N644 Mastodynia: Secondary | ICD-10-CM

## 2023-05-12 NOTE — Progress Notes (Signed)
Subjective:     Sherry [name] is a 22 y.o. female here for problem gyn visit. She is breastfeeding her 69 month old son. Her work schedule was recently changed (approx one week ago) and she is now working night shift. She pumps five times daily and baby takes breastmilk via bottle. She recently noticed some breast tenderness. She has also had a fever (took meds last evening). No fever or chills today.   The following portions of the patient's history were reviewed and updated as appropriate: allergies, current medications, past family history, past medical history, past social history, past surgical history, and problem list.   Review of Systems Pertinent items are noted in HPI.    Objective:    BP 116/78 (BP Location: Left Arm, Patient Position: Sitting)   Pulse (!) 108   Ht 5' 3.5" (1.613 m)   Wt 172 lb 6.4 oz (78.2 kg)   BMI 30.06 kg/m  General appearance: alert, cooperative, and appears stated age Breasts: normal appearance, no masses or tenderness, Inspection negative, No nipple retraction or dimpling, No nipple discharge or bleeding, No axillary or supraclavicular adenopathy, Normal to palpation without dominant masses, patient is breastfeeding, lactating bilaterally. No breast erythema noted. No bruising or trauma  noted. No clogged ducts palpable.     Assessment:    Normal Breast Exam in Lactating Mother.    Plan:    Pt reassured that breast exam is normal and there is no fever or low-grade fever present today. She will monitor for breast erythema, red streaks, clogged ducts, fever or chills. Pt encouraged to continue to empty breasts adequately. Breasts are likely readjusting to a change in her feeding schedule.  Letta Kocher

## 2023-07-03 NOTE — Progress Notes (Deleted)
 23 y.o. [id number] Single Asian female here for annual exam.    No LMP recorded.          Sexually active: {yes no:314532}  The current method of family planning is {contraception:315051}.     The pregnancy intention screening data noted above was reviewed. Potential methods of contraception were discussed. The patient elected to proceed with No data recorded.  Exercising: {yes no:314532}  {types:19826} Smoker:  {YES NO:22349}  Health Maintenance: Pap:  *** History of abnormal Pap:  {YES NO:22349} MMG:  *** Colonoscopy:  *** BMD:   *** Screening Labs: ***   reports that she has never smoked. She has never used smokeless tobacco. She reports that she does not currently use alcohol. She reports that she does not use drugs.  Past Medical History:  Diagnosis Date   Allergy    seasonal   History of gestational hypertension     Past Surgical History:  Procedure Laterality Date   WISDOM TOOTH EXTRACTION      Current Outpatient Medications  Medication Sig Dispense Refill   norethindrone (MICRONOR) 0.35 MG tablet Take 1 tablet (0.35 mg total) by mouth daily. (Patient not taking: Reported on 05/12/2023) 90 tablet 3   Prenatal Vit-Fe Fumarate-FA (MULTIVITAMIN-PRENATAL) 27-0.8 MG TABS tablet Take 1 tablet by mouth daily at 12 noon. (Patient not taking: Reported on 05/12/2023)     senna-docusate (SENOKOT-S) 8.6-50 MG tablet Take 2 tablets by mouth daily. (Patient not taking: Reported on 05/12/2023) 60 tablet 1   No current facility-administered medications for this visit.    Family History  Problem Relation Age of Onset   Stroke Maternal Grandmother    Kidney disease Maternal Grandmother    Hypertension Maternal Grandmother    Hyperlipidemia Maternal Grandmother    Heart disease Maternal Grandmother    Miscarriages / Stillbirths Mother    Hypertension Mother     ROS: Constitutional: {Findings; ROS constitutional:30497::"negative"} Genitourinary:{Findings; ROS  genitourinary:19593::"negative"}  Exam:   There were no vitals taken for this visit.     General appearance: alert, cooperative and appears stated age Head: Normocephalic, without obvious abnormality, atraumatic Neck: no adenopathy, supple, symmetrical, trachea midline and thyroid {EXAM; THYROID:18604} Lungs: clear to auscultation bilaterally Breasts: {Exam; breast:13139::"normal appearance, no masses or tenderness"} Heart: regular rate and rhythm Abdomen: soft, non-tender; bowel sounds normal; no masses,  no organomegaly Extremities: extremities normal, atraumatic, no cyanosis or edema Skin: Skin color, texture, turgor normal. No rashes or lesions Lymph nodes: Cervical, supraclavicular, and axillary nodes normal. No abnormal inguinal nodes palpated Neurologic: Grossly normal   Pelvic: External genitalia:  no lesions              Urethra:  normal appearing urethra with no masses, tenderness or lesions              Bartholins and Skenes: normal                 Vagina: normal appearing vagina with normal color and no discharge, no lesions              Cervix: {exam; cervix:14595}              Pap taken: {yes no:314532} Bimanual Exam:  Uterus:  {exam; uterus:12215}              Adnexa: {exam; adnexa:12223}               Rectovaginal: Confirms               Anus:  normal sphincter tone, no lesions  Chaperone, ***, CMA, was present for exam.  Assessment/Plan:

## 2023-07-10 ENCOUNTER — Ambulatory Visit (HOSPITAL_BASED_OUTPATIENT_CLINIC_OR_DEPARTMENT_OTHER): Payer: BC Managed Care – PPO | Admitting: Certified Nurse Midwife

## 2023-07-21 ENCOUNTER — Encounter (HOSPITAL_BASED_OUTPATIENT_CLINIC_OR_DEPARTMENT_OTHER): Payer: Self-pay | Admitting: Certified Nurse Midwife

## 2023-07-21 ENCOUNTER — Ambulatory Visit (HOSPITAL_BASED_OUTPATIENT_CLINIC_OR_DEPARTMENT_OTHER): Payer: BC Managed Care – PPO | Admitting: Certified Nurse Midwife

## 2023-07-21 VITALS — BP 113/65 | HR 75 | Ht 63.5 in | Wt 173.0 lb

## 2023-07-21 DIAGNOSIS — N86 Erosion and ectropion of cervix uteri: Secondary | ICD-10-CM

## 2023-07-21 DIAGNOSIS — Z01419 Encounter for gynecological examination (general) (routine) without abnormal findings: Secondary | ICD-10-CM

## 2023-07-21 DIAGNOSIS — Z3041 Encounter for surveillance of contraceptive pills: Secondary | ICD-10-CM | POA: Diagnosis not present

## 2023-07-21 MED ORDER — NORETHINDRONE 0.35 MG PO TABS: 1.0000 | ORAL_TABLET | Freq: Every day | ORAL | 5 refills | Status: AC

## 2023-07-21 NOTE — Progress Notes (Signed)
 23 y.o. [id number] Single Asian female here for annual exam.  She will be moving to [place] in a few weeks. Has accepted a new job there with [employer] affairs [employer]. Her boyfriend lives in [place] (Father of her 2 children). His Mom will take care of the children for a week or two while she settles in to her new position. Pt is breastfeeding and satsified with progesterone-only pill. She would like to continue POP at this time. Reports that on occasion she experiences some bright red bleeding with intercourse. Started her period yesterday.   Patient's last menstrual period was 07/20/2023.          Sexually active: Yes.    The current method of family planning is oral progesterone-only contraceptive.    Exercising: Yes.     Smoker:  no  Health Maintenance: Pap:  UTD 2024 History of abnormal Pap:  no    reports that she has never smoked. She has never used smokeless tobacco. She reports that she does not currently use alcohol. She reports that she does not use drugs.  Past Medical History:  Diagnosis Date   Allergy    seasonal   History of gestational hypertension     Past Surgical History:  Procedure Laterality Date   WISDOM TOOTH EXTRACTION      Current Outpatient Medications  Medication Sig Dispense Refill   norethindrone (MICRONOR) 0.35 MG tablet Take 1 tablet (0.35 mg total) by mouth daily. 90 tablet 5   Prenatal Vit-Fe Fumarate-FA (MULTIVITAMIN-PRENATAL) 27-0.8 MG TABS tablet Take 1 tablet by mouth daily at 12 noon. (Patient not taking: Reported on 07/21/2023)     senna-docusate (SENOKOT-S) 8.6-50 MG tablet Take 2 tablets by mouth daily. (Patient not taking: Reported on 03/18/2023) 60 tablet 1   No current facility-administered medications for this visit.    Family History  Problem Relation Age of Onset   Stroke Maternal Grandmother    Kidney disease Maternal Grandmother    Hypertension Maternal Grandmother    Hyperlipidemia Maternal Grandmother    Heart disease  Maternal Grandmother    Miscarriages / Stillbirths Mother    Hypertension Mother     ROS: Constitutional: negative Genitourinary:negative  Exam:   BP 113/65 (BP Location: Right Arm, Patient Position: Sitting)   Pulse 75   Ht 5' 3.5" (1.613 m)   Wt 173 lb (78.5 kg)   LMP 07/20/2023   Breastfeeding Yes   BMI 30.16 kg/m   Height: 5' 3.5" (161.3 cm)  General appearance: alert, cooperative and appears stated age Head: Normocephalic, without obvious abnormality, atraumatic Abdomen: soft, non-tender; bowel sounds normal; no masses,  no organomegaly Extremities: extremities normal, atraumatic, no cyanosis or edema Skin: Skin color, texture, turgor normal. No rashes or lesions Lymph nodes: Cervical, supraclavicular, and axillary nodes normal. No abnormal inguinal nodes palpated Neurologic: Grossly normal   Pelvic: External genitalia:  no lesions              Urethra:  normal appearing urethra with no masses, tenderness or lesions              Bartholins and Skenes: normal                 Vagina: normal appearing vagina with normal color and no discharge, no lesions              Cervix:  ectropion present              Pap taken: No. Bimanual Exam:  Uterus:  normal size, contour, position, consistency, mobility, non-tender              Adnexa: no mass, fullness, tenderness               Rectovaginal: Confirms               Anus:  normal sphincter tone, no lesions  Chaperone, Tonya, CMA, was present for exam.  Assessment/Plan: 1. Encounter for annual routine gynecological examination (Primary) - Pap smear UTD and not due at this time - Pt desires to continue POP (breastfeeding) - Postpartum Depression/Anxiety Screen Negative  2. Cervical ectropion - Pt reassured that ectropion can be caused by hormonal changes (pregnancy and contraceptives)  3. Oral contraceptive pill surveillance - Breastfeeding. Would like to continue POP. Refill sent.  RTO 1 year for annual gyn exam and  prn if issues arise. Letta Kocher

## 2023-12-24 DIAGNOSIS — Z0001 Encounter for general adult medical examination with abnormal findings: Secondary | ICD-10-CM | POA: Diagnosis not present

## 2023-12-24 DIAGNOSIS — R0789 Other chest pain: Secondary | ICD-10-CM | POA: Diagnosis not present

## 2023-12-24 DIAGNOSIS — Z23 Encounter for immunization: Secondary | ICD-10-CM | POA: Diagnosis not present

## 2023-12-24 DIAGNOSIS — Z13220 Encounter for screening for lipoid disorders: Secondary | ICD-10-CM | POA: Diagnosis not present

## 2023-12-24 DIAGNOSIS — R739 Hyperglycemia, unspecified: Secondary | ICD-10-CM | POA: Diagnosis not present

## 2024-01-19 DIAGNOSIS — Z01419 Encounter for gynecological examination (general) (routine) without abnormal findings: Secondary | ICD-10-CM | POA: Diagnosis not present

## 2024-01-19 DIAGNOSIS — Z113 Encounter for screening for infections with a predominantly sexual mode of transmission: Secondary | ICD-10-CM | POA: Diagnosis not present
